# Patient Record
Sex: Female | Born: 1935 | Race: Asian | Hispanic: No | Marital: Married | Smoking: Former smoker
Health system: Southern US, Community
[De-identification: ages and names within clinical notes are randomized; demographics above are authoritative.]

## PROBLEM LIST (undated history)

## (undated) DIAGNOSIS — M1990 Unspecified osteoarthritis, unspecified site: Secondary | ICD-10-CM

## (undated) DIAGNOSIS — K279 Peptic ulcer, site unspecified, unspecified as acute or chronic, without hemorrhage or perforation: Secondary | ICD-10-CM

## (undated) DIAGNOSIS — I10 Essential (primary) hypertension: Secondary | ICD-10-CM

## (undated) DIAGNOSIS — E7800 Pure hypercholesterolemia, unspecified: Secondary | ICD-10-CM

---

## 1968-09-11 DIAGNOSIS — K279 Peptic ulcer, site unspecified, unspecified as acute or chronic, without hemorrhage or perforation: Secondary | ICD-10-CM

## 1968-09-11 HISTORY — DX: Peptic ulcer, site unspecified, unspecified as acute or chronic, without hemorrhage or perforation: K27.9

## 1997-10-10 ENCOUNTER — Other Ambulatory Visit: Admission: RE | Admit: 1997-10-10 | Discharge: 1997-10-10 | Payer: Self-pay | Admitting: Family Medicine

## 1999-02-06 ENCOUNTER — Encounter: Admission: RE | Admit: 1999-02-06 | Discharge: 1999-02-06 | Payer: Self-pay | Admitting: Family Medicine

## 1999-02-06 ENCOUNTER — Encounter: Payer: Self-pay | Admitting: Family Medicine

## 1999-11-02 ENCOUNTER — Encounter (INDEPENDENT_AMBULATORY_CARE_PROVIDER_SITE_OTHER): Payer: Self-pay | Admitting: Specialist

## 1999-11-02 ENCOUNTER — Ambulatory Visit (HOSPITAL_COMMUNITY): Admission: RE | Admit: 1999-11-02 | Discharge: 1999-11-02 | Payer: Self-pay | Admitting: Gastroenterology

## 2000-03-21 ENCOUNTER — Encounter: Admission: RE | Admit: 2000-03-21 | Discharge: 2000-03-21 | Payer: Self-pay | Admitting: Gastroenterology

## 2000-03-21 ENCOUNTER — Encounter: Payer: Self-pay | Admitting: Gastroenterology

## 2000-04-05 ENCOUNTER — Ambulatory Visit (HOSPITAL_COMMUNITY): Admission: RE | Admit: 2000-04-05 | Discharge: 2000-04-05 | Payer: Self-pay | Admitting: Gastroenterology

## 2000-04-05 ENCOUNTER — Encounter (INDEPENDENT_AMBULATORY_CARE_PROVIDER_SITE_OTHER): Payer: Self-pay

## 2000-05-10 ENCOUNTER — Encounter: Payer: Self-pay | Admitting: Family Medicine

## 2000-05-10 ENCOUNTER — Encounter: Admission: RE | Admit: 2000-05-10 | Discharge: 2000-05-10 | Payer: Self-pay | Admitting: Family Medicine

## 2001-06-01 ENCOUNTER — Encounter: Payer: Self-pay | Admitting: Family Medicine

## 2001-06-01 ENCOUNTER — Encounter: Admission: RE | Admit: 2001-06-01 | Discharge: 2001-06-01 | Payer: Self-pay | Admitting: Family Medicine

## 2001-11-17 ENCOUNTER — Encounter: Payer: Self-pay | Admitting: Family Medicine

## 2001-11-17 ENCOUNTER — Encounter: Admission: RE | Admit: 2001-11-17 | Discharge: 2001-11-17 | Payer: Self-pay | Admitting: Family Medicine

## 2002-06-04 ENCOUNTER — Encounter: Admission: RE | Admit: 2002-06-04 | Discharge: 2002-06-04 | Payer: Self-pay | Admitting: Family Medicine

## 2002-06-04 ENCOUNTER — Encounter: Payer: Self-pay | Admitting: Family Medicine

## 2002-08-31 ENCOUNTER — Other Ambulatory Visit: Admission: RE | Admit: 2002-08-31 | Discharge: 2002-08-31 | Payer: Self-pay | Admitting: Internal Medicine

## 2004-10-26 ENCOUNTER — Other Ambulatory Visit: Admission: RE | Admit: 2004-10-26 | Discharge: 2004-10-26 | Payer: Self-pay | Admitting: Family Medicine

## 2004-11-10 ENCOUNTER — Encounter: Admission: RE | Admit: 2004-11-10 | Discharge: 2004-11-10 | Payer: Self-pay | Admitting: Family Medicine

## 2005-11-11 ENCOUNTER — Encounter: Admission: RE | Admit: 2005-11-11 | Discharge: 2005-11-11 | Payer: Self-pay | Admitting: Family Medicine

## 2006-11-14 ENCOUNTER — Encounter: Admission: RE | Admit: 2006-11-14 | Discharge: 2006-11-14 | Payer: Self-pay | Admitting: Family Medicine

## 2007-12-18 ENCOUNTER — Encounter: Admission: RE | Admit: 2007-12-18 | Discharge: 2007-12-18 | Payer: Self-pay | Admitting: Family Medicine

## 2008-11-11 ENCOUNTER — Encounter: Admission: RE | Admit: 2008-11-11 | Discharge: 2008-11-11 | Payer: Self-pay | Admitting: Family Medicine

## 2009-01-20 ENCOUNTER — Encounter: Admission: RE | Admit: 2009-01-20 | Discharge: 2009-01-20 | Payer: Self-pay | Admitting: Family Medicine

## 2010-01-21 ENCOUNTER — Encounter
Admission: RE | Admit: 2010-01-21 | Discharge: 2010-01-21 | Payer: Self-pay | Source: Home / Self Care | Attending: Family Medicine | Admitting: Family Medicine

## 2010-05-29 NOTE — Procedures (Signed)
[facility]  Patient:    Jillian Campbell, Jillian Campbell                         MRN: [id number] Proc. Date: 04/05/00 Adm. Date:  [id number] Attending:  Orland Mustard CC:         Duncan Dull, M.D.   Procedure Report  PROCEDURE:  Esophagogastroduodenoscopy with biopsy.  MEDICATIONS:  Cetacaine spray, fentanyl 50 mcg, Versed 7 mg IV.  INDICATIONS FOR PROCEDURE:  A nice 77 year old woman who had an endoscopy in October with the finding of small gastric ulcer. The patients been treated, a repeat upper GI still showed a persistent area of collection consistent with a continued ulcer. This procedure is done to evaluate the area endoscopically and rebiopsy.  DESCRIPTION OF PROCEDURE:  The procedure had been explained to the patient and consent obtained. With the patient in the left lateral decubitus position, the Olympus adult video endoscope was inserted blindly in the esophagus and advanced under direct visualization. The stomach was entered, pylorus identified and passed. The duodenum including the bulb and second portion were seen well and were unremarkable. The scope withdrawn back in the stomach. Just above the incisura along the lesser curve was a shallow 1/2 cm ulcer. This was photographed and biopsied. An area away from the ulcer was also biopsied and sent for a rapid urease test with Helicobacter. No other ulcerations were seen. The scope was withdrawn. The proximal stomach including the fundus and cardia were seen well in the retroflexed view and were normal. The distal and proximal esophagus were seen well and were normal. The scope withdrawn. The patient tolerated the procedure well.  ASSESSMENT:  Ulcer still present.  PLAN:  Will continue the patient on Prevacid and see back in my office in three to four weeks. Will check path and make further recommendations depending on the results. DD:  04/05/00 TD:  04/05/00 Job: [id number]

## 2010-12-21 ENCOUNTER — Other Ambulatory Visit: Payer: Self-pay | Admitting: Family Medicine

## 2010-12-21 DIAGNOSIS — Z1231 Encounter for screening mammogram for malignant neoplasm of breast: Secondary | ICD-10-CM

## 2011-01-25 ENCOUNTER — Ambulatory Visit
Admission: RE | Admit: 2011-01-25 | Discharge: 2011-01-25 | Disposition: A | Discharge status: Home / Self Care | Payer: Medicare Other | Source: Ambulatory Visit | Attending: Family Medicine | Admitting: Family Medicine

## 2011-01-25 DIAGNOSIS — Z1231 Encounter for screening mammogram for malignant neoplasm of breast: Secondary | ICD-10-CM

## 2011-11-29 ENCOUNTER — Other Ambulatory Visit: Payer: Self-pay | Admitting: Family Medicine

## 2011-11-29 DIAGNOSIS — M810 Age-related osteoporosis without current pathological fracture: Secondary | ICD-10-CM

## 2011-12-17 ENCOUNTER — Ambulatory Visit
Admission: RE | Admit: 2011-12-17 | Discharge: 2011-12-17 | Disposition: A | Discharge status: Home / Self Care | Payer: Medicare Other | Source: Ambulatory Visit | Attending: Family Medicine | Admitting: Family Medicine

## 2011-12-17 DIAGNOSIS — M810 Age-related osteoporosis without current pathological fracture: Secondary | ICD-10-CM

## 2013-03-02 ENCOUNTER — Encounter (HOSPITAL_COMMUNITY): Payer: Self-pay | Admitting: Emergency Medicine

## 2013-03-02 ENCOUNTER — Observation Stay (HOSPITAL_COMMUNITY)
Admission: EM | Admit: 2013-03-02 | Discharge: 2013-03-05 | Disposition: A | Discharge status: Home / Self Care | Payer: Medicare Other | Attending: Orthopaedic Surgery | Admitting: Orthopaedic Surgery

## 2013-03-02 ENCOUNTER — Emergency Department (HOSPITAL_COMMUNITY): Payer: Medicare Other

## 2013-03-02 DIAGNOSIS — S42409A Unspecified fracture of lower end of unspecified humerus, initial encounter for closed fracture: Secondary | ICD-10-CM

## 2013-03-02 DIAGNOSIS — S42473A Displaced transcondylar fracture of unspecified humerus, initial encounter for closed fracture: Secondary | ICD-10-CM | POA: Diagnosis present

## 2013-03-02 DIAGNOSIS — W19XXXA Unspecified fall, initial encounter: Secondary | ICD-10-CM | POA: Insufficient documentation

## 2013-03-02 DIAGNOSIS — I10 Essential (primary) hypertension: Secondary | ICD-10-CM | POA: Insufficient documentation

## 2013-03-02 DIAGNOSIS — Z87891 Personal history of nicotine dependence: Secondary | ICD-10-CM | POA: Insufficient documentation

## 2013-03-02 DIAGNOSIS — S42413A Displaced simple supracondylar fracture without intercondylar fracture of unspecified humerus, initial encounter for closed fracture: Principal | ICD-10-CM | POA: Insufficient documentation

## 2013-03-02 HISTORY — DX: Essential (primary) hypertension: I10

## 2013-03-02 LAB — CBC WITH DIFFERENTIAL/PLATELET
BASOS ABS: 0 10*3/uL (ref 0.0–0.1)
BASOS PCT: 0 % (ref 0–1)
EOS ABS: 0 10*3/uL (ref 0.0–0.7)
EOS PCT: 0 % (ref 0–5)
HCT: 34.5 % — ABNORMAL LOW (ref 36.0–46.0)
Hemoglobin: 11 g/dL — ABNORMAL LOW (ref 12.0–15.0)
LYMPHS ABS: 0.8 10*3/uL (ref 0.7–4.0)
Lymphocytes Relative: 11 % — ABNORMAL LOW (ref 12–46)
MCH: 28.8 pg (ref 26.0–34.0)
MCHC: 31.9 g/dL (ref 30.0–36.0)
MCV: 90.3 fL (ref 78.0–100.0)
MONO ABS: 0.8 10*3/uL (ref 0.1–1.0)
Monocytes Relative: 12 % (ref 3–12)
NEUTROS PCT: 76 % (ref 43–77)
Neutro Abs: 5.2 10*3/uL (ref 1.7–7.7)
PLATELETS: 210 10*3/uL (ref 150–400)
RBC: 3.82 MIL/uL — AB (ref 3.87–5.11)
RDW: 12.3 % (ref 11.5–15.5)
WBC: 6.8 10*3/uL (ref 4.0–10.5)

## 2013-03-02 LAB — BASIC METABOLIC PANEL
BUN: 30 mg/dL — ABNORMAL HIGH (ref 6–23)
CO2: 27 meq/L (ref 19–32)
Calcium: 9.1 mg/dL (ref 8.4–10.5)
Chloride: 96 mEq/L (ref 96–112)
Creatinine, Ser: 1.32 mg/dL — ABNORMAL HIGH (ref 0.50–1.10)
GFR calc Af Amer: 43 mL/min — ABNORMAL LOW (ref >90)
GFR calc non Af Amer: 37 mL/min — ABNORMAL LOW (ref >90)
GLUCOSE: 135 mg/dL — AB (ref 70–99)
POTASSIUM: 4.2 meq/L (ref 3.7–5.3)
SODIUM: 137 meq/L (ref 137–147)

## 2013-03-02 LAB — PROTIME-INR
INR: 1.03 (ref 0.00–1.49)
PROTHROMBIN TIME: 13.3 s (ref 11.6–15.2)

## 2013-03-02 MED ORDER — MORPHINE SULFATE 2 MG/ML IJ SOLN
2.0000 mg | INTRAMUSCULAR | Status: DC | PRN
  Administered 2013-03-02: 2 mg via INTRAVENOUS
  Filled 2013-03-02: qty 1

## 2013-03-02 MED ORDER — SODIUM CHLORIDE 0.9 % IV SOLN
INTRAVENOUS | Status: DC
  Administered 2013-03-02 – 2013-03-03 (×2): via INTRAVENOUS

## 2013-03-02 MED ORDER — DIPHENHYDRAMINE HCL 25 MG PO CAPS: 25.0000 mg | ORAL_CAPSULE | Freq: Four times a day (QID) | ORAL | Status: DC | PRN

## 2013-03-02 MED ORDER — HYDROCODONE-ACETAMINOPHEN 5-325 MG PO TABS: 1.0000 | ORAL_TABLET | ORAL | Status: DC | PRN

## 2013-03-02 MED ORDER — HYDROCODONE-ACETAMINOPHEN 5-325 MG PO TABS
2.0000 | ORAL_TABLET | Freq: Once | ORAL | Status: AC
  Administered 2013-03-02: 2 via ORAL
  Filled 2013-03-02: qty 2

## 2013-03-02 NOTE — Progress Notes (Signed)
   CARE MANAGEMENT ED NOTE 03/02/2013  Patient:  Jillian Campbell,Jillian Campbell   Account Number:  [id number]  Date Initiated:  03/02/2013  Documentation initiated by:  Edd ArbourGIBBS,Elhadji Pecore  Subjective/Objective Assessment:   80 yr old [employer] medicare complete pt with fall, injury to right arm no pcp listed     Subjective/Objective Assessment Detail:   pt confirm pcp as donna gates     Action/Plan:   epic updated   Action/Plan Detail:   Anticipated DC Date:       Status Recommendation to Physician:   Result of Recommendation:    Other ED Services  Consult Working Psychologist, educationallan    DC Planning Services  Other  Outpatient Services - Pt will follow up  PCP issues    Choice offered to / List presented to:            Status of service:  Completed, signed off  ED Comments:   ED Comments Detail:

## 2013-03-02 NOTE — ED Provider Notes (Signed)
CSN: [id number]     Arrival date & time 03/02/13  1542 History   First MD Initiated Contact with Patient 03/02/13 1554     Chief Complaint  Patient presents with  . Fall     (Consider location/radiation/quality/duration/timing/severity/associated sxs/prior Treatment) HPI Comments: Patient presents to the ED with a chief complaint of fall.  The fall was mechanical from losing her balance.  She states that she fell twice 2 days ago.  She complains of pain on her right elbow and left flank.  She has not taken anything to alleviate her symptoms.  She does not take any blood thinners.  The pain is constant.  It is worse with movement.  She is able to ambulate.  She denies any chest pain, SOB, or abdominal pain.  The history is provided by the patient. No language interpreter was used.    Past Medical History  Diagnosis Date  . Hypertension    History reviewed. No pertinent past surgical history. No family history on file. History  Substance Use Topics  . Smoking status: Former Smoker -- 0.50 packs/day for 40 years    Types: Cigarettes    Quit date: 01/12/1968  . Smokeless tobacco: Never Used  . Alcohol Use: No   OB History   Grav Para Term Preterm Abortions TAB SAB Ect Mult Living                 Review of Systems  All other systems reviewed and are negative.      Allergies  Review of patient's allergies indicates no known allergies.  Home Medications  No current outpatient prescriptions on file. BP 126/58  Pulse 81  Temp(Src) 97.8 F (36.6 C) (Oral)  Resp 17  SpO2 93% Physical Exam  Nursing note and vitals reviewed. Constitutional: She is oriented to person, place, and time. She appears well-developed and well-nourished.  HENT:  Head: Normocephalic and atraumatic.  Eyes: Conjunctivae and EOM are normal. Pupils are equal, round, and reactive to light.  Neck: Normal range of motion. Neck supple.  Cardiovascular: Normal rate and regular rhythm.  Exam reveals no  gallop and no friction rub.   No murmur heard. Pulmonary/Chest: Effort normal and breath sounds normal. No respiratory distress. She has no wheezes. She has no rales. She exhibits no tenderness.  Abdominal: Soft. Bowel sounds are normal. She exhibits no distension and no mass. There is no tenderness. There is no rebound and no guarding.  No focal abdominal tenderness, no RLQ tenderness or pain at McBurney's point, no RUQ tenderness or Murphy's sign, no left-sided abdominal tenderness, no fluid wave, or signs of peritonitis   Musculoskeletal: Normal range of motion. She exhibits no edema and no tenderness.  Extensive ecchymosis of the entire right arm circumferentially, no pain with PROM, exquisitely tender over the right elbow  No pain on palpation of other extremities, hip, neck, or back  Otherwise, ROM and strength is 5/5  Neurological: She is alert and oriented to person, place, and time.  Skin: Skin is warm and dry.  Additional ecchymosis noted on right lateral neck below the ear and on the left flank, above the hip, but below the kidney  Psychiatric: She has a normal mood and affect. Her behavior is normal. Judgment and thought content normal.    ED Course  Procedures (including critical care time) Results for orders placed during the hospital encounter of 03/02/13  PROTIME-INR      Result Value Ref Range   Prothrombin Time 13.3  11.6 - 15.2 seconds   INR 1.03  0.00 - 1.49  CBC WITH DIFFERENTIAL      Result Value Ref Range   WBC 6.8  4.0 - 10.5 K/uL   RBC 3.82 (*) 3.87 - 5.11 MIL/uL   Hemoglobin 11.0 (*) 12.0 - 15.0 g/dL   HCT 96.0 (*) 45.4 - 09.8 %   MCV 90.3  78.0 - 100.0 fL   MCH 28.8  26.0 - 34.0 pg   MCHC 31.9  30.0 - 36.0 g/dL   RDW 11.9  14.7 - 82.9 %   Platelets 210  150 - 400 K/uL   Neutrophils Relative % 76  43 - 77 %   Neutro Abs 5.2  1.7 - 7.7 K/uL   Lymphocytes Relative 11 (*) 12 - 46 %   Lymphs Abs 0.8  0.7 - 4.0 K/uL   Monocytes Relative 12  3 - 12 %    Monocytes Absolute 0.8  0.1 - 1.0 K/uL   Eosinophils Relative 0  0 - 5 %   Eosinophils Absolute 0.0  0.0 - 0.7 K/uL   Basophils Relative 0  0 - 1 %   Basophils Absolute 0.0  0.0 - 0.1 K/uL  BASIC METABOLIC PANEL      Result Value Ref Range   Sodium 137  137 - 147 mEq/L   Potassium 4.2  3.7 - 5.3 mEq/L   Chloride 96  96 - 112 mEq/L   CO2 27  19 - 32 mEq/L   Glucose, Bld 135 (*) 70 - 99 mg/dL   BUN 30 (*) 6 - 23 mg/dL   Creatinine, Ser 5.62 (*) 0.50 - 1.10 mg/dL   Calcium 9.1  8.4 - 13.0 mg/dL   GFR calc non Af Amer 37 (*) >90 mL/min   GFR calc Af Amer 43 (*) >90 mL/min   Dg Elbow 2 Views Right  03/02/2013   CLINICAL DATA:  [place] Seat and injured right arm. Bruising throughout the arm.  EXAM: RIGHT ELBOW - 2 VIEW  COMPARISON:  Right forearm x-rays obtained concurrently.  FINDINGS: Comminuted transverse fracture involving the supracondylar humerus. Humeroulnar joint and humeroradial joint intact with moderate joint space narrowing. No other visible fractures about the elbow. Large joint effusion/hemarthrosis. Diffuse soft tissue swelling.  IMPRESSION: Comminuted transverse fracture involving the supracondylar humerus. Elbow joint intact with degenerative changes.   Electronically Signed   By: Hulan Saas M.D.   On: 03/02/2013 18:19   Dg Forearm Right  03/02/2013   CLINICAL DATA:  Fall, elbow pain  EXAM: RIGHT FOREARM - 2 VIEW  COMPARISON:  None.  FINDINGS: There is an acute displaced right distal humerus supracondylar fracture. Diffuse soft tissue swelling noted on the lateral view. Bones are osteopenic. Radius and ulna appear intact.  IMPRESSION: Acute displaced right distal humerus supracondylar fracture at the elbow.   Electronically Signed   By: Ruel Favors M.D.   On: 03/02/2013 18:20      EKG Interpretation   None       MDM   Final diagnoses:  Fracture of distal humerus    4:15 PM Patient seen by and discussed with Dr. Fredderick Phenix.  Will obtain plain films.  No evidence of  compartment syndrome.  No pain with passive ROM, good pulses.  Question dislocation.  Labs and xrays.   7:45 PM Discussed the patient with Dr. Melvyn Novas, who recommends consulting general orthopedic surgery.    Discussed the patient with Dr. Magnus Ivan, who recommends a well padded posterior splint  and sling.  He will see the patient on Monday.  Patient seen by orthopedic PA, who will admit the patient.  Surgery tomorrow.   Roxy Horsemanobert Dagmar Adcox, PA-C 03/02/13 2036

## 2013-03-02 NOTE — H&P (Signed)
I have seen Mrs. Jillian Campbell, examined her, and spoken to her and her husband in length about her right distal humerus fracture.  They understand that surgery is being recommended to treat this complicated injury.

## 2013-03-02 NOTE — H&P (Signed)
Jillian Campbell is an 80 y.o. female.   Chief Complaint: Right elbow fracture HPI:80 year old female who fell 2 days ago with right elbow pain. Denies any LOC, dizziness or chest pain. Only complaint is right elbow pain.  Past Medical History  Diagnosis Date  . Hypertension     History reviewed. No pertinent past surgical history.  No family history on file. Social History:  reports that she quit smoking about 45 years ago. Her smoking use included Cigarettes. She has a 20 pack-year smoking history. She has never used smokeless tobacco. She reports that she does not drink alcohol or use illicit drugs.  Allergies: No Known Allergies   (Not in a hospital admission)  Results for orders placed during the hospital encounter of 03/02/13 (from the past 48 hour(s))  PROTIME-INR     Status: None   Collection Time    03/02/13  4:38 PM      Result Value Ref Range   Prothrombin Time 13.3  11.6 - 15.2 seconds   INR 1.03  0.00 - 1.49  CBC WITH DIFFERENTIAL     Status: Abnormal   Collection Time    03/02/13  4:38 PM      Result Value Ref Range   WBC 6.8  4.0 - 10.5 K/uL   RBC 3.82 (*) 3.87 - 5.11 MIL/uL   Hemoglobin 11.0 (*) 12.0 - 15.0 g/dL   HCT 34.5 (*) 36.0 - 46.0 %   MCV 90.3  78.0 - 100.0 fL   MCH 28.8  26.0 - 34.0 pg   MCHC 31.9  30.0 - 36.0 g/dL   RDW 12.3  11.5 - 15.5 %   Platelets 210  150 - 400 K/uL   Neutrophils Relative % 76  43 - 77 %   Neutro Abs 5.2  1.7 - 7.7 K/uL   Lymphocytes Relative 11 (*) 12 - 46 %   Lymphs Abs 0.8  0.7 - 4.0 K/uL   Monocytes Relative 12  3 - 12 %   Monocytes Absolute 0.8  0.1 - 1.0 K/uL   Eosinophils Relative 0  0 - 5 %   Eosinophils Absolute 0.0  0.0 - 0.7 K/uL   Basophils Relative 0  0 - 1 %   Basophils Absolute 0.0  0.0 - 0.1 K/uL  BASIC METABOLIC PANEL     Status: Abnormal   Collection Time    03/02/13  4:38 PM      Result Value Ref Range   Sodium 137  137 - 147 mEq/L   Potassium 4.2  3.7 - 5.3 mEq/L   Chloride 96  96 - 112 mEq/L   CO2  27  19 - 32 mEq/L   Glucose, Bld 135 (*) 70 - 99 mg/dL   BUN 30 (*) 6 - 23 mg/dL   Creatinine, Ser 1.32 (*) 0.50 - 1.10 mg/dL   Calcium 9.1  8.4 - 10.5 mg/dL   GFR calc non Af Amer 37 (*) >90 mL/min   GFR calc Af Amer 43 (*) >90 mL/min   Comment: (NOTE)     The eGFR has been calculated using the CKD EPI equation.     This calculation has not been validated in all clinical situations.     eGFR's persistently <90 mL/min signify possible Chronic Kidney     Disease.   Dg Elbow 2 Views Right  03/02/2013   CLINICAL DATA:  [place] and injured right arm. Bruising throughout the arm.  EXAM: RIGHT ELBOW - 2 VIEW  COMPARISON:  Right forearm x-rays obtained concurrently.  FINDINGS: Comminuted transverse fracture involving the supracondylar humerus. Humeroulnar joint and humeroradial joint intact with moderate joint space narrowing. No other visible fractures about the elbow. Large joint effusion/hemarthrosis. Diffuse soft tissue swelling.  IMPRESSION: Comminuted transverse fracture involving the supracondylar humerus. Elbow joint intact with degenerative changes.   Electronically Signed   By: Evangeline Dakin M.D.   On: 03/02/2013 18:19   Dg Forearm Right  03/02/2013   CLINICAL DATA:  Fall, elbow pain  EXAM: RIGHT FOREARM - 2 VIEW  COMPARISON:  None.  FINDINGS: There is an acute displaced right distal humerus supracondylar fracture. Diffuse soft tissue swelling noted on the lateral view. Bones are osteopenic. Radius and ulna appear intact.  IMPRESSION: Acute displaced right distal humerus supracondylar fracture at the elbow.   Electronically Signed   By: Daryll Brod M.D.   On: 03/02/2013 18:20    Review of Systems  Constitutional: Negative.   HENT: Negative.   Respiratory: Negative.   Cardiovascular: Negative.   Gastrointestinal: Negative.   Musculoskeletal:       Right arm pain  Skin: Negative.   Neurological: Negative.   Endo/Heme/Allergies: Negative.   Psychiatric/Behavioral: Negative.      Blood pressure 144/44, pulse 73, temperature 98.8 F (37.1 C), temperature source Oral, resp. rate 20, SpO2 90.00%. Physical Exam  Constitutional: She is oriented to person, place, and time. She appears well-developed and well-nourished.  HENT:  Head: Normocephalic and atraumatic.  Eyes: EOM are normal.  Cardiovascular: Normal rate.   Radial pulses present bilateral  Respiratory: Effort normal.  Musculoskeletal:  Deformity of right elbow Bilateral lower lower extremities Good range of motion knees and hips without pain. Non tender bilateral lower extremities  Neurological: She is alert and oriented to person, place, and time.  Sensation intact right hand with full motor  Skin: Skin is warm and dry.  Right distal upper and complete forearm with significant ecchymosis.  Tenting of skin at radial side of distal humerus.  Ecchymosis of the right side of neck  Psychiatric: She has a normal mood and affect.     Assessment/Plan Comminuted transverse fracture involving the supracondylar right humerus.  Admit for ORIF of right  Tomorrow. Right arm splinted at 45 degrees with posterior splint applied. Tenting of skin resolved with 45 degrees of flexion . Patient place in sling . Motor and sensation rechecked and remained intact.  Erskine Emery 03/02/2013, 8:51 PM

## 2013-03-02 NOTE — ED Provider Notes (Signed)
Medical screening examination/treatment/procedure(s) were conducted as a shared visit with non-physician practitioner(s) and myself.  I personally evaluated the patient during the encounter.    Pt with left elbow pain and swelling. She has been notable deformity. There's no evidence of compartment syndrome. X-ray shows a distal humerus fracture. She'll be admitted to orthopedic  Rolan BuccoMelanie Elvera Almario, MD 03/02/13 2324

## 2013-03-02 NOTE — ED Notes (Addendum)
Pt reports falling twice at home two days ago. Pt presents with multiple areas of ecchymosis. Pt has ecchymosis noted behind the right ear and left flank during brief exam. Pt has right arm pain and has ecchymosis from above the elbow to the hand. The hand has moderate swelling and is blue/purple in appearance.

## 2013-03-02 NOTE — ED Notes (Signed)
Case management at bedside.

## 2013-03-02 NOTE — ED Notes (Signed)
MD at bedside. 

## 2013-03-03 ENCOUNTER — Encounter (HOSPITAL_COMMUNITY): Payer: Medicare Other | Admitting: Certified Registered Nurse Anesthetist

## 2013-03-03 ENCOUNTER — Encounter (HOSPITAL_COMMUNITY)
Admission: EM | Disposition: A | Discharge status: Home / Self Care | Payer: Self-pay | Source: Home / Self Care | Attending: Orthopaedic Surgery

## 2013-03-03 ENCOUNTER — Encounter (HOSPITAL_COMMUNITY): Payer: Self-pay | Admitting: Certified Registered Nurse Anesthetist

## 2013-03-03 ENCOUNTER — Observation Stay (HOSPITAL_COMMUNITY): Payer: Medicare Other

## 2013-03-03 ENCOUNTER — Inpatient Hospital Stay (HOSPITAL_COMMUNITY): Payer: Medicare Other | Admitting: Certified Registered Nurse Anesthetist

## 2013-03-03 DIAGNOSIS — S42473A Displaced transcondylar fracture of unspecified humerus, initial encounter for closed fracture: Secondary | ICD-10-CM | POA: Diagnosis present

## 2013-03-03 HISTORY — PX: OPEN REDUCTION INTERNAL FIXATION (ORIF) DISTAL RADIAL FRACTURE: SHX5989

## 2013-03-03 LAB — SURGICAL PCR SCREEN
MRSA, PCR: NEGATIVE
STAPHYLOCOCCUS AUREUS: NEGATIVE

## 2013-03-03 SURGERY — OPEN REDUCTION INTERNAL FIXATION (ORIF) DISTAL RADIUS FRACTURE
Anesthesia: General | Site: Arm Upper | Laterality: Right

## 2013-03-03 MED ORDER — METOCLOPRAMIDE HCL 5 MG/ML IJ SOLN: 5.0000 mg | Freq: Three times a day (TID) | INTRAMUSCULAR | Status: DC | PRN

## 2013-03-03 MED ORDER — CEFAZOLIN SODIUM-DEXTROSE 2-3 GM-% IV SOLR
INTRAVENOUS | Status: AC
  Filled 2013-03-03: qty 50

## 2013-03-03 MED ORDER — METHOCARBAMOL 500 MG PO TABS
500.0000 mg | ORAL_TABLET | Freq: Four times a day (QID) | ORAL | Status: DC | PRN
  Administered 2013-03-03 – 2013-03-05 (×4): 500 mg via ORAL
  Filled 2013-03-03 (×4): qty 1

## 2013-03-03 MED ORDER — METHOCARBAMOL 100 MG/ML IJ SOLN
500.0000 mg | Freq: Four times a day (QID) | INTRAVENOUS | Status: DC | PRN
  Administered 2013-03-03: 500 mg via INTRAVENOUS
  Filled 2013-03-03: qty 5

## 2013-03-03 MED ORDER — ASPIRIN 81 MG PO CHEW
81.0000 mg | CHEWABLE_TABLET | Freq: Two times a day (BID) | ORAL | Status: DC
  Filled 2013-03-03: qty 1

## 2013-03-03 MED ORDER — OXYCODONE HCL 5 MG PO TABS
5.0000 mg | ORAL_TABLET | ORAL | Status: DC | PRN
Start: 2013-03-03 — End: 2013-03-05
  Administered 2013-03-03: 5 mg via ORAL
  Administered 2013-03-03 – 2013-03-04 (×2): 10 mg via ORAL
  Filled 2013-03-03: qty 1
  Filled 2013-03-03 (×2): qty 2

## 2013-03-03 MED ORDER — CEFAZOLIN SODIUM-DEXTROSE 2-3 GM-% IV SOLR
2.0000 g | Freq: Once | INTRAVENOUS | Status: AC
  Administered 2013-03-03: 2 g via INTRAVENOUS
  Filled 2013-03-03: qty 50

## 2013-03-03 MED ORDER — PROPOFOL 10 MG/ML IV BOLUS
INTRAVENOUS | Status: DC | PRN
  Administered 2013-03-03: 100 mg via INTRAVENOUS

## 2013-03-03 MED ORDER — MORPHINE SULFATE 2 MG/ML IJ SOLN
1.0000 mg | INTRAMUSCULAR | Status: DC | PRN
  Administered 2013-03-03: 1 mg via INTRAVENOUS
  Filled 2013-03-03: qty 1

## 2013-03-03 MED ORDER — SODIUM CHLORIDE 0.9 % IV SOLN: INTRAVENOUS | Status: DC

## 2013-03-03 MED ORDER — ONDANSETRON HCL 4 MG/2ML IJ SOLN
INTRAMUSCULAR | Status: AC
  Filled 2013-03-03: qty 2

## 2013-03-03 MED ORDER — DIPHENHYDRAMINE HCL 12.5 MG/5ML PO ELIX: 12.5000 mg | ORAL_SOLUTION | ORAL | Status: DC | PRN

## 2013-03-03 MED ORDER — LACTATED RINGERS IV SOLN: INTRAVENOUS | Status: DC

## 2013-03-03 MED ORDER — FENTANYL CITRATE 0.05 MG/ML IJ SOLN
INTRAMUSCULAR | Status: DC | PRN
  Administered 2013-03-03 (×5): 50 ug via INTRAVENOUS

## 2013-03-03 MED ORDER — HYDROMORPHONE HCL PF 1 MG/ML IJ SOLN
0.2500 mg | INTRAMUSCULAR | Status: DC | PRN
  Administered 2013-03-03 (×2): 0.5 mg via INTRAVENOUS

## 2013-03-03 MED ORDER — FENTANYL CITRATE 0.05 MG/ML IJ SOLN
INTRAMUSCULAR | Status: AC
  Filled 2013-03-03: qty 5

## 2013-03-03 MED ORDER — AMLODIPINE BESYLATE 2.5 MG PO TABS
2.5000 mg | ORAL_TABLET | Freq: Every day | ORAL | Status: DC
  Administered 2013-03-03 – 2013-03-05 (×2): 2.5 mg via ORAL
  Filled 2013-03-03 (×3): qty 1

## 2013-03-03 MED ORDER — 0.9 % SODIUM CHLORIDE (POUR BTL) OPTIME
TOPICAL | Status: DC | PRN
  Administered 2013-03-03: 1000 mL

## 2013-03-03 MED ORDER — LISINOPRIL 20 MG PO TABS
20.0000 mg | ORAL_TABLET | Freq: Every day | ORAL | Status: DC
  Administered 2013-03-03 – 2013-03-05 (×2): 20 mg via ORAL
  Filled 2013-03-03 (×3): qty 1

## 2013-03-03 MED ORDER — CEFAZOLIN SODIUM 1-5 GM-% IV SOLN
1.0000 g | Freq: Four times a day (QID) | INTRAVENOUS | Status: AC
  Administered 2013-03-03 – 2013-03-04 (×3): 1 g via INTRAVENOUS
  Filled 2013-03-03 (×3): qty 50

## 2013-03-03 MED ORDER — ONDANSETRON HCL 4 MG/2ML IJ SOLN
INTRAMUSCULAR | Status: DC | PRN
  Administered 2013-03-03: 4 mg via INTRAVENOUS

## 2013-03-03 MED ORDER — HYDROMORPHONE HCL PF 1 MG/ML IJ SOLN
INTRAMUSCULAR | Status: AC
  Filled 2013-03-03: qty 1

## 2013-03-03 MED ORDER — METOCLOPRAMIDE HCL 10 MG PO TABS: 5.0000 mg | ORAL_TABLET | Freq: Three times a day (TID) | ORAL | Status: DC | PRN

## 2013-03-03 MED ORDER — HYDROCODONE-ACETAMINOPHEN 5-325 MG PO TABS
1.0000 | ORAL_TABLET | ORAL | Status: DC | PRN
  Administered 2013-03-03: 2 via ORAL
  Filled 2013-03-03: qty 2

## 2013-03-03 MED ORDER — PROPOFOL 10 MG/ML IV BOLUS
INTRAVENOUS | Status: AC
  Filled 2013-03-03: qty 20

## 2013-03-03 MED ORDER — ONDANSETRON HCL 4 MG/2ML IJ SOLN: 4.0000 mg | Freq: Four times a day (QID) | INTRAMUSCULAR | Status: DC | PRN

## 2013-03-03 MED ORDER — LIDOCAINE HCL (CARDIAC) 20 MG/ML IV SOLN
INTRAVENOUS | Status: AC
  Filled 2013-03-03: qty 5

## 2013-03-03 MED ORDER — LACTATED RINGERS IV SOLN
INTRAVENOUS | Status: DC | PRN
  Administered 2013-03-03: 08:00:00 via INTRAVENOUS

## 2013-03-03 MED ORDER — SUCCINYLCHOLINE CHLORIDE 20 MG/ML IJ SOLN
INTRAMUSCULAR | Status: DC | PRN
  Administered 2013-03-03: 100 mg via INTRAVENOUS

## 2013-03-03 MED ORDER — ATENOLOL 25 MG PO TABS
25.0000 mg | ORAL_TABLET | Freq: Every day | ORAL | Status: DC
  Administered 2013-03-03 – 2013-03-05 (×2): 25 mg via ORAL
  Filled 2013-03-03 (×3): qty 1

## 2013-03-03 MED ORDER — ONDANSETRON HCL 4 MG PO TABS: 4.0000 mg | ORAL_TABLET | Freq: Four times a day (QID) | ORAL | Status: DC | PRN

## 2013-03-03 MED ORDER — DEXAMETHASONE SODIUM PHOSPHATE 10 MG/ML IJ SOLN
INTRAMUSCULAR | Status: AC
  Filled 2013-03-03: qty 1

## 2013-03-03 MED ORDER — SIMVASTATIN 20 MG PO TABS
20.0000 mg | ORAL_TABLET | Freq: Every day | ORAL | Status: DC
  Administered 2013-03-03 – 2013-03-04 (×2): 20 mg via ORAL
  Filled 2013-03-03 (×3): qty 1

## 2013-03-03 MED ORDER — ASPIRIN 81 MG PO CHEW
81.0000 mg | CHEWABLE_TABLET | Freq: Every day | ORAL | Status: DC
  Administered 2013-03-04 – 2013-03-05 (×2): 81 mg via ORAL
  Filled 2013-03-03 (×2): qty 1

## 2013-03-03 MED ORDER — LIDOCAINE HCL (CARDIAC) 20 MG/ML IV SOLN
INTRAVENOUS | Status: DC | PRN
  Administered 2013-03-03: 50 mg via INTRAVENOUS

## 2013-03-03 SURGICAL SUPPLY — 70 items
BAG SPEC THK2 15X12 ZIP CLS (MISCELLANEOUS) ×1
BAG ZIPLOCK 12X15 (MISCELLANEOUS) ×3 IMPLANT
BANDAGE ELASTIC 3 VELCRO ST LF (GAUZE/BANDAGES/DRESSINGS) ×3 IMPLANT
BANDAGE ELASTIC 4 VELCRO ST LF (GAUZE/BANDAGES/DRESSINGS) ×5 IMPLANT
BIT DRILL 2.0 LNG QUCK RELEASE (BIT) IMPLANT
BIT DRILL 2.8 QUICK RELEASE (BIT) IMPLANT
BLADE OSCILLATING/SAGITTAL (BLADE) ×3
BLADE SW THK.38XMED LNG THN (BLADE) IMPLANT
CLOSURE WOUND 1/2 X4 (GAUZE/BANDAGES/DRESSINGS) ×1
CUFF TOURN SGL QUICK 18 (TOURNIQUET CUFF) ×3 IMPLANT
DRAPE C-ARM 42X120 X-RAY (DRAPES) ×3 IMPLANT
DRAPE U-SHAPE 47X51 STRL (DRAPES) ×3 IMPLANT
DRILL 2.0 LNG QUICK RELEASE (BIT) ×3
DRILL 2.8 QUICK RELEASE (BIT) ×3
DRSG ADAPTIC 3X8 NADH LF (GAUZE/BANDAGES/DRESSINGS) ×3 IMPLANT
DURAPREP 26ML APPLICATOR (WOUND CARE) ×3 IMPLANT
ELECT REM PT RETURN 9FT ADLT (ELECTROSURGICAL) ×3
ELECTRODE REM PT RTRN 9FT ADLT (ELECTROSURGICAL) ×1 IMPLANT
GAUZE XEROFORM 5X9 LF (GAUZE/BANDAGES/DRESSINGS) ×2 IMPLANT
GLOVE BIO SURGEON STRL SZ7.5 (GLOVE) ×3 IMPLANT
GLOVE BIOGEL PI IND STRL 8 (GLOVE) ×1 IMPLANT
GLOVE BIOGEL PI INDICATOR 8 (GLOVE) ×2
GLOVE ECLIPSE 8.0 STRL XLNG CF (GLOVE) ×3 IMPLANT
GOWN STRL REUS W/TWL XL LVL3 (GOWN DISPOSABLE) ×3 IMPLANT
GUIDEWIRE ORTHO MINI ACTK .045 (WIRE) ×6 IMPLANT
KIT BASIN OR (CUSTOM PROCEDURE TRAY) ×3 IMPLANT
NS IRRIG 1000ML POUR BTL (IV SOLUTION) ×3 IMPLANT
PACK LOWER EXTREMITY WL (CUSTOM PROCEDURE TRAY) ×3 IMPLANT
PAD ABD 8X10 STRL (GAUZE/BANDAGES/DRESSINGS) ×3 IMPLANT
PAD CAST 3X4 CTTN HI CHSV (CAST SUPPLIES) ×1 IMPLANT
PAD CAST 4YDX4 CTTN HI CHSV (CAST SUPPLIES) ×1 IMPLANT
PADDING CAST COTTON 3X4 STRL (CAST SUPPLIES) ×3
PADDING CAST COTTON 4X4 STRL (CAST SUPPLIES) ×6
PLATE BONE 85MM OLECRN RT NAR (Plate) ×2 IMPLANT
PLATE BONE POST 5H RT ELBOW (Plate) ×2 IMPLANT
PLATE MEDIAL 8 HOLE (Plate) ×2 IMPLANT
PLATE TAP FOR 3.5 SCREW (TAP) ×2 IMPLANT
POSITIONER SURGICAL ARM (MISCELLANEOUS) ×5 IMPLANT
SCREW CORTICAL 3.5X20MM (Screw) ×2 IMPLANT
SCREW HEX LOCK 2.7X14MM (Screw) ×6 IMPLANT
SCREW HEX LOCK 2.7X16MM (Screw) ×2 IMPLANT
SCREW HEX LOCK 3.5X20MM (Screw) ×4 IMPLANT
SCREW HEX NON LOCK 3.5X34MM (Screw) ×2 IMPLANT
SCREW HEXALOBE LOCK 3.5X22MM (Screw) ×2 IMPLANT
SCREW HEXALOBE LOCKING 3.5X14M (Screw) ×2 IMPLANT
SCREW HEXALOBE LOCKING 3.5X16M (Screw) ×2 IMPLANT
SCREW HEXALOBE NON-LOCK 3.5X16 (Screw) ×6 IMPLANT
SCREW LOCK 18X2.7X HEXALOBE (Screw) IMPLANT
SCREW LOCK 40X3.5X HEXALOBE (Screw) IMPLANT
SCREW LOCK 55X3.5X HEXALOBE (Screw) IMPLANT
SCREW LOCKING 2.7X18MM (Screw) ×3 IMPLANT
SCREW LOCKING 3.5X40 (Screw) ×3 IMPLANT
SCREW LOCKING 3.5X55 (Screw) ×3 IMPLANT
SCREW NON LOCK 3.5X40 (Screw) ×2 IMPLANT
SCREW NON LOCKING HEX 3.5X22 (Screw) ×2 IMPLANT
SCREW NON LOCKING HEX 3.5X24 (Screw) ×2 IMPLANT
SCREW NON LOCKING HEX 3.5X30 (Screw) ×2 IMPLANT
SCREW NON LOCKING HEX 3.5X45 (Screw) ×2 IMPLANT
SPONGE GAUZE 4X4 12PLY (GAUZE/BANDAGES/DRESSINGS) ×3 IMPLANT
STRIP CLOSURE SKIN 1/2X4 (GAUZE/BANDAGES/DRESSINGS) ×2 IMPLANT
SUT ETHILON 3 0 PS 1 (SUTURE) ×6 IMPLANT
SUT MNCRL AB 4-0 PS2 18 (SUTURE) ×3 IMPLANT
SUT VIC AB 0 CT1 27 (SUTURE) ×9
SUT VIC AB 0 CT1 27XBRD ANTBC (SUTURE) ×2 IMPLANT
SUT VIC AB 2-0 CT1 27 (SUTURE) ×6
SUT VIC AB 2-0 CT1 TAPERPNT 27 (SUTURE) ×1 IMPLANT
TOWEL OR 17X26 10 PK STRL BLUE (TOWEL DISPOSABLE) ×6 IMPLANT
TOWEL OR NON WOVEN STRL DISP B (DISPOSABLE) ×3 IMPLANT
UNDERPAD 30X30 INCONTINENT (UNDERPADS AND DIAPERS) ×1 IMPLANT
WATER STERILE IRR 1500ML POUR (IV SOLUTION) ×1 IMPLANT

## 2013-03-03 NOTE — Anesthesia Postprocedure Evaluation (Signed)
  Anesthesia Post-op Note  Patient: Jillian Campbell  Procedure(s) Performed: Procedure(s) (LRB): OPEN REDUCTION INTERNAL FIXATION (ORIF) DISTAL HUMERUS FRACTURE (Right)  Patient Location: PACU  Anesthesia Type: General  Level of Consciousness: awake and alert   Airway and Oxygen Therapy: Patient Spontanous Breathing  Post-op Pain: mild  Post-op Assessment: Post-op Vital signs reviewed, Patient's Cardiovascular Status Stable, Respiratory Function Stable, Patent Airway and No signs of Nausea or vomiting  Last Vitals:  Filed Vitals:   03/03/13 1127  BP: 121/62  Pulse: 68  Temp: 36.6 C  Resp: 16    Post-op Vital Signs: stable   Complications: No apparent anesthesia complications

## 2013-03-03 NOTE — Transfer of Care (Signed)
Immediate Anesthesia Transfer of Care Note  Patient: Jillian EhlersKimiko Campbell  Procedure(s) Performed: Procedure(s): OPEN REDUCTION INTERNAL FIXATION (ORIF) DISTAL HUMERUS FRACTURE (Right)  Patient Location: PACU  Anesthesia Type:General  Level of Consciousness: awake and oriented  Airway & Oxygen Therapy: Patient Spontanous Breathing and Patient connected to face mask oxygen  Post-op Assessment: Report given to PACU RN and Post -op Vital signs reviewed and stable  Post vital signs: Reviewed and stable  Complications: No apparent anesthesia complications

## 2013-03-03 NOTE — Anesthesia Preprocedure Evaluation (Addendum)
Anesthesia Evaluation  Patient identified by MRN, date of birth, ID band Patient awake    Reviewed: Allergy & Precautions, H&P , NPO status , Patient's Chart, lab work & pertinent test results, reviewed documented beta blocker date and time   Airway Mallampati: II TM Distance: >3 FB Neck ROM: full    Dental  (+) Edentulous Lower, Dental Advisory Given   Pulmonary neg pulmonary ROS, former smoker,  breath sounds clear to auscultation  Pulmonary exam normal       Cardiovascular Exercise Tolerance: Good hypertension, Pt. on home beta blockers Rhythm:regular Rate:Normal     Neuro/Psych negative neurological ROS  negative psych ROS   GI/Hepatic negative GI ROS, Neg liver ROS,   Endo/Other  negative endocrine ROS  Renal/GU negative Renal ROS  negative genitourinary   Musculoskeletal   Abdominal   Peds  Hematology negative hematology ROS (+)   Anesthesia Other Findings   Reproductive/Obstetrics negative OB ROS                          Anesthesia Physical Anesthesia Plan  ASA: II  Anesthesia Plan: General   Post-op Pain Management:    Induction: Intravenous  Airway Management Planned: LMA  Additional Equipment:   Intra-op Plan:   Post-operative Plan:   Informed Consent: I have reviewed the patients History and Physical, chart, labs and discussed the procedure including the risks, benefits and alternatives for the proposed anesthesia with the patient or authorized representative who has indicated his/her understanding and acceptance.   Dental Advisory Given  Plan Discussed with: CRNA and Surgeon  Anesthesia Plan Comments:         Anesthesia Quick Evaluation

## 2013-03-03 NOTE — Op Note (Signed)
NAMESEJAL, Jillian [name]:  [id number]  MEDICAL RECORD NO.:  [id number]  LOCATION:  1607                         FACILITY:  [facility]  PHYSICIAN:  Vanita Panda. Magnus Ivan, M.D.DATE OF BIRTH:  08-05-1930  DATE OF PROCEDURE:  03/03/2013 DATE OF DISCHARGE:                              OPERATIVE REPORT   PREOPERATIVE DIAGNOSIS:  Right extra-articular, but transcondylar distal humerus fracture.  POSTOPERATIVE DIAGNOSIS:  Right extra-articular, but transcondylar distal humerus fracture.  PROCEDURE:  Open reduction and internal fixation of right distal humerus fracture including olecranon osteotomy.  SURGEON:  Vanita Panda. Magnus Ivan, M.D.  ASSISTANT:  Richardean Canal, P.A.  ANESTHESIA:  General.  BLOOD LOSS:  Less than 100 mL.  TOURNIQUET TIME:  Less than 2 hours.  COMPLICATIONS:  None.  INDICATIONS:  Jillian Campbell is an 80 year old female who 2 days ago slipped on the ice at her home injuring her right elbow.  After bruising, swelling, and pain, she came to the emergency room last evening and was found to have an extra-articular, but quite distal, distal humerus fracture of her right arm.  It was significantly displaced and I recommended that she undergo open reduction and internal fixation of this fracture.  Given the distal nature of the fracture, I also recommend that we would need to perform an olecranon osteotomy to get it reduced as much as possible.  She is certainly elderly and her husband understands the need for this surgery as well as the risk of infection and blood loss, nerve and vessel injury, nonunion.  PROCEDURE DESCRIPTION:  After informed consent was obtained, appropriate right arm was marked.  She was brought to the operating room, placed supine on the operating table.  General anesthesia was then obtained. She was then turned into a lateral position with the right operative arm up and across the front over there was an axillary roll in place  and appropriate padding of her legs, neck, head, and down on operative arm. Her right arm was then prepped and draped from the axillary area down to the wrist with DuraPrep and sterile drapes including sterile stockinette.  We then placed a sterile tourniquet.  Time-out was called and she was identified as correct patient, correct right arm.  I then used an Esmarch to wrap out the arm and tourniquet was inflated to 250 mmHg.  I then made a midline incision on the posterior aspect of the elbow and carried this proximally and distally.  I dissected down to the elbow itself.  I then was able to see that the triceps was torn significantly in terms of the muscle belly and I could easily identify the fracture.  It was quite distal so I felt I would need to do perform olecranon osteotomy.  I first identified the ulnar nerve and transposed the nerve.  I then used an oscillating saw and made olecranon osteotomy and brought this superior it to it and identified the fracture.  I cleaned the fracture of debris and was able to temporarily hold it in place with K-wires and reduction forceps.  I then used Acumed distal humeral locking plates and place a plate direct medial and another one posterolateral to secure  the fracture in a reduced position.  We then repaired our olecranon osteotomy using Acumed olecranon plate as well. I put the elbow through range of motion and was able to find the fracture adequately reduced and good motion of the elbow with no blocks to this.  I then verified the placement of screws under direct fluoroscopy.  We then irrigated the soft tissues with normal saline solution and closed the deep tissue over the plate with 0-Vicryl followed by 2-0 Vicryl in the subcutaneous tissue, interrupted 3-0 nylon on the skin.  Xeroform and well-padded sterile dressing was applied.  We let the tourniquet down with the fingers pink and nicely.  She was awakened, extubated, and taken to the  recovery room in stable condition. All final counts were correct.  There were no complications noted.  Postoperatively, she will be returned to the floor for IV antibiotics and pain medicine with treatment in a sling and hopefully discharged in the next 24 hours.  Of note, [name], PA-C assisted during the entire case and his assistance was crucial for helping hold the fracture reduced while we plated the fracture and layered closure of the wound.     Vanita Pandahristopher Y. Magnus IvanBlackman, M.D.     CYB/MEDQ  D:  03/03/2013  T:  03/03/2013  Job:  [id number]

## 2013-03-03 NOTE — H&P (Signed)
  She and her husband fully understand that we are proceeding to surgery this am for fixation of her right distal humerus fracture.

## 2013-03-03 NOTE — Brief Op Note (Signed)
03/02/2013 - 03/03/2013  10:39 AM  PATIENT:  Neta EhlersKimiko Haste  80 y.o. female  PRE-OPERATIVE DIAGNOSIS:  Right distal humerus fracture  POST-OPERATIVE DIAGNOSIS:  Right distal humerus fracture  PROCEDURE:  Procedure(s): OPEN REDUCTION INTERNAL FIXATION (ORIF) DISTAL HUMERUS FRACTURE (Right)  SURGEON:  Surgeon(s) and Role:    * Kathryne Hitchhristopher Y Zarria Towell, MD - Primary  PHYSICIAN ASSISTANT: Rexene EdisonGil Clark, PA-C  ANESTHESIA:   general  EBL:  Total I/O In: 300 [I.V.:300] Out: -   BLOOD ADMINISTERED:none  DRAINS: none   LOCAL MEDICATIONS USED:  NONE  SPECIMEN:  No Specimen  DISPOSITION OF SPECIMEN:  N/A  COUNTS:  YES  TOURNIQUET:   Total Tourniquet Time Documented: Upper Arm (Right) - 118 minutes Total: Upper Arm (Right) - 118 minutes   DICTATION: .Other Dictation: Dictation Number [id number]  PLAN OF CARE: Admit to inpatient   PATIENT DISPOSITION:  PACU - hemodynamically stable.   Delay start of Pharmacological VTE agent (>24hrs) due to surgical blood loss or risk of bleeding: not applicable

## 2013-03-03 NOTE — Progress Notes (Signed)
Patient ambulated to bathroom. Very unsteady and impulsive. Sat on edge of bed for two minutes prior to standing and complained of dizziness during walk to bathroom. Gildardo CrankerAnita Priddy, RN

## 2013-03-04 MED ORDER — POLYETHYLENE GLYCOL 3350 17 G PO PACK
17.0000 g | PACK | Freq: Once | ORAL | Status: AC
  Administered 2013-03-04: 17 g via ORAL

## 2013-03-04 NOTE — Evaluation (Signed)
Physical Therapy Evaluation Patient Details Name: Jillian Campbell MRN: [id number] DOB: 11-07-1930 Today's Date: 03/04/2013 Time: 0950-1030 PT Time Calculation (min): 40 min  PT Assessment / Plan / Recommendation History of Present Illness  fall with distal humerus fx s/p ORIF  Clinical Impression  Pt s/p R humerus fx presents with inability to use R UE and associated pain and balance deficits limiting functional mobility.  This date pt ambulating in hall with marked improvement in stability utilizing [facility] but still requiring min to min guard assist.  Pt's husband present during session and states he is comfortable assisting her at home.  [facility] requested and discussed use of gait belt with spouse.      PT Assessment  Patient needs continued PT services    Follow Up Recommendations  No PT follow up (spouse declines HHPT)    Does the patient have the potential to tolerate intense rehabilitation      Barriers to Discharge        Equipment Recommendations  Other (comment) (Small base quad cane)    Recommendations for Other Services OT consult   Frequency Min 6X/week    Precautions / Restrictions Precautions Precautions: Fall Required Braces or Orthoses: Sling Restrictions Weight Bearing Restrictions: Yes RUE Weight Bearing: Non weight bearing   Pertinent Vitals/Pain 4/10;       Mobility  Bed Mobility Overal bed mobility: Needs Assistance General bed mobility comments: husband has been assisting Transfers Overall transfer level: Needs assistance Equipment used: Quad cane Transfers: Sit to/from Stand Sit to Stand: Min assist General transfer comment: cues for transition position and hand placement to self assist Ambulation/Gait Ambulation/Gait assistance: Min assist Ambulation Distance (Feet): 200 Feet Assistive device: Straight cane;Quad cane;1 person hand held assist Gait Pattern/deviations: Step-to pattern;Decreased step length - right;Decreased step length -  left;Shuffle;Trunk flexed Gait velocity: decr General Gait Details: cues for sequence, cane placement and posture.  Pt ambulated 20' HHA, 69' with SPC and 100' with SBQC.  Increased stability with SPC and Marked improvement in stability with use of [facility]    Exercises     PT Diagnosis: Difficulty walking  PT Problem List: Decreased activity tolerance;Decreased balance;Decreased mobility;Decreased knowledge of use of DME;Pain PT Treatment Interventions: DME instruction;Gait training;Stair training;Functional mobility training;Therapeutic activities;Therapeutic exercise;Patient/family education     PT Goals(Current goals can be found in the care plan section) Acute Rehab PT Goals Patient Stated Goal: to go home PT Goal Formulation: With patient Time For Goal Achievement: 03/09/13 Potential to Achieve Goals: Good  Visit Information  Last PT Received On: 03/04/13 Assistance Needed: +1 History of Present Illness: fall with distal humerus fx s/p ORIF       Prior Functioning  Home Living Family/patient expects to be discharged to:: Private residence Living Arrangements: Spouse/significant other Available Help at Discharge: Family;Available 24 hours/day Type of Home: House Home Access: Stairs to enter Entrance Stairs-Number of Steps: 1 (Threshold) Entrance Stairs-Rails: None Home Layout: One level Home Equipment: None Prior Function Level of Independence: Independent Communication Communication: No difficulties Dominant Hand: Right    Cognition  Cognition Arousal/Alertness: Awake/alert Behavior During Therapy: WFL for tasks assessed/performed Overall Cognitive Status: Within Functional Limits for tasks assessed    Extremity/Trunk Assessment Upper Extremity Assessment Upper Extremity Assessment: RUE deficits/detail RUE Deficits / Details: no orders for ROM as of yet so kept immobilized in sling Lower Extremity Assessment Lower Extremity Assessment: Defer to PT evaluation    Balance    End of Session PT - End of Session Equipment Utilized During  Treatment: Gait belt Activity Tolerance: Patient tolerated treatment well Patient left: Other (comment) (at bathroom door with OT) Nurse Communication: Mobility status;Other (comment)  GP Functional Assessment Tool Used: clinical judgement Functional Limitation: Mobility: Walking and moving around Mobility: Walking and Moving Around Current Status [phone](G8978): At least 20 percent but less than 40 percent impaired, limited or restricted Mobility: Walking and Moving Around Goal Status [phone](G8979): At least 1 percent but less than 20 percent impaired, limited or restricted   [facility] Jude Medical CenterBRADSHAW,Griffith Santilli 03/04/2013, 11:31 AM

## 2013-03-04 NOTE — Progress Notes (Signed)
Subjective: 1 Day Post-Op Procedure(s) (LRB): OPEN REDUCTION INTERNAL FIXATION (ORIF) DISTAL HUMERUS FRACTURE (Right) Patient reports pain as mild.  Slow progress with PT.  Objective: Vital signs in last 24 hours: Temp:  [98.9 F (37.2 C)-101.3 F (38.5 C)] 99 F (37.2 C) (02/22 1448) Pulse Rate:  [69-85] 84 (02/22 1448) Resp:  [16-18] 18 (02/22 1448) BP: (94-132)/(50-63) 106/58 mmHg (02/22 1448) SpO2:  [90 %-95 %] 95 % (02/22 1448)  Intake/Output from previous day: 02/21 0701 - 02/22 0700 In: 1830 [P.O.:240; I.V.:1485; IV Piggyback:105] Out: -  Intake/Output this shift: Total I/O In: 240 [P.O.:240] Out: 200 [Urine:200]   Recent Labs  03/02/13 1638  HGB 11.0*    Recent Labs  03/02/13 1638  WBC 6.8  RBC 3.82*  HCT 34.5*  PLT 210    Recent Labs  03/02/13 1638  NA 137  K 4.2  CL 96  CO2 27  BUN 30*  CREATININE 1.32*  GLUCOSE 135*  CALCIUM 9.1    Recent Labs  03/02/13 1638  INR 1.03    Right Upper Extremity: Dressing clean dry and intact Hand motor and sensation intact Hand perfused    Assessment/Plan: 1 Day Post-Op Procedure(s) (LRB): OPEN REDUCTION INTERNAL FIXATION (ORIF) DISTAL HUMERUS FRACTURE (Right) Up with therapy Plan discharge tomorrow  Richardean CanalCLARK, GILBERT 03/04/2013, 4:27 PM

## 2013-03-04 NOTE — Progress Notes (Signed)
Occupational Therapy Evaluation Patient Details Name: Jillian Campbell MRN: [id number] DOB: [birth year] Today's Date: 03/04/2013 Time: 4098-1191 OT Time Calculation (min): 15 min  OT Assessment / Plan / Recommendation History of present illness fall with distal humerus fx s/p ORIF   Clinical Impression   Patient presents with decreased ADL independence and safety. Will benefit from skilled OT to maximize independence prior to discharge home with husband.    OT Assessment  Patient needs continued OT Services    Follow Up Recommendations  Supervision/Assistance - 24 hour    Barriers to Discharge      Equipment Recommendations  None recommended by OT    Recommendations for Other Services    Frequency  Min 3X/week    Precautions / Restrictions Restrictions Weight Bearing Restrictions: Yes RUE Weight Bearing: Non weight bearing   Pertinent Vitals/Pain No c/o pain    ADL  Eating/Feeding: Performed;Moderate assistance Where Assessed - Eating/Feeding: Edge of bed Grooming: Performed;Wash/dry hands;Minimal assistance Where Assessed - Grooming: Supported standing Upper Body Bathing: Simulated;Minimal assistance Where Assessed - Upper Body Bathing: Unsupported sitting Lower Body Bathing: Simulated;Minimal assistance Where Assessed - Lower Body Bathing: Supported sit to stand Upper Body Dressing: Performed;+1 Total assistance (sling) Where Assessed - Upper Body Dressing: Unsupported sitting Lower Body Dressing: Simulated;Moderate assistance Where Assessed - Lower Body Dressing: Supported sit to Pharmacist, hospital: Performed;Minimal Dentist Method: Sit to stand;Stand pivot Acupuncturist: Regular height toilet Toileting - Clothing Manipulation and Hygiene: Performed;Minimal assistance Where Assessed - Engineer, mining and Hygiene: Sit to stand from 3-in-1 or toilet Transfers/Ambulation Related to ADLs: Patient using Quad cane in left  hand for ambulation and transfers. Shower transfer deferred; educated pt and husband that MD would have to clear pt to shower when indicated. Pt verbalized understanding of sponge bathing for now. ADL Comments: Educated pt and husband regarding loose fitting clothing and button down shirts vs robes due to bulkiness of pt's dressing. Educated pt/husband on sling outside of clothing.     OT Diagnosis: Generalized weakness;Acute pain  OT Problem List: Decreased strength;Impaired balance (sitting and/or standing);Decreased safety awareness;Decreased knowledge of use of DME or AE;Impaired UE functional use OT Treatment Interventions: Self-care/ADL training;DME and/or AE instruction;Therapeutic activities;Patient/family education   OT Goals(Current goals can be found in the care plan section) Acute Rehab OT Goals Patient Stated Goal: to go home OT Goal Formulation: With patient Time For Goal Achievement: 03/18/13 Potential to Achieve Goals: Good  Visit Information  Last OT Received On: 03/04/13 Assistance Needed: +1 History of Present Illness: fall with distal humerus fx s/p ORIF       Prior Functioning     Home Living Family/patient expects to be discharged to:: Private residence Living Arrangements: Spouse/significant other Available Help at Discharge: Family;Available 24 hours/day Home Equipment: None Prior Function Level of Independence: Independent Communication Communication: No difficulties Dominant Hand: Right         Cognition  Cognition Arousal/Alertness: Awake/alert Behavior During Therapy: WFL for tasks assessed/performed Overall Cognitive Status: Within Functional Limits for tasks assessed    Extremity/Trunk Assessment Upper Extremity Assessment Upper Extremity Assessment: RUE deficits/detail RUE Deficits / Details: no orders for ROM as of yet so kept immobilized in sling Lower Extremity Assessment Lower Extremity Assessment: Defer to PT evaluation     End  of Session OT - End of Session Equipment Utilized During Treatment: Gait belt;Other (comment) (quad cane) Activity Tolerance: Patient tolerated treatment well Patient left: in bed;with call bell/phone within reach;with family/visitor present  GO  Functional Limitation: Self care Self Care Current Status [phone]): At least 40 percent but less than 60 percent impaired, limited or restricted Self Care Goal Status ([id number](G8988): At least 1 percent but less than 20 percent impaired, limited or restricted   Jillian Campbell A 03/04/2013, 11:01 AM

## 2013-03-04 NOTE — Progress Notes (Signed)
   CARE MANAGEMENT NOTE 03/04/2013  Patient:  Neta EhlersBRAM,Starr   Account Number:  [id number]  Date Initiated:  03/03/2013  Documentation initiated by:  [facility] HospitalHAVIS,Ronte Parker  Subjective/Objective Assessment:   mech fall, OPEN REDUCTION INTERNAL FIXATION (ORIF) DISTAL HUMERUS FRACTURE     Action/Plan:   HH   Anticipated DC Date:  03/05/2013   Anticipated DC Plan:  HOME W HOME HEALTH SERVICES      DC Planning Services  CM consult      [facility]   Choice offered to / List presented to:  C-3 Spouse        HH arranged  HH-2 PT  HH-3 OT      [facility]  [facility]   Status of service:  Completed, signed off Medicare Important Message given?   (If response is "NO", the following Medicare IM given date fields will be blank) Date Medicare IM given:   Date Additional Medicare IM given:    Discharge Disposition:    Per UR Regulation:    If discussed at Long Length of Stay Meetings, dates discussed:    Comments:  03/04/2013 1530 NCM spoke to pt and gave permission to speak to husband or dtr, Barbaraann BarthelKimberly Beal # [phone]. Spoke to [facility] and [facility]. States cane will work better for pt at home. Has cane in the room. [facility] and [facility]. Attempted call to dtr, Barbaraann BarthelKimberly Beal. Left message on voice mail. Isidoro DonningAlesia Zulay Corrie RN CCM Case Mgmt phone [phone]

## 2013-03-05 ENCOUNTER — Encounter (HOSPITAL_COMMUNITY): Payer: Self-pay | Admitting: Orthopaedic Surgery

## 2013-03-05 MED ORDER — HYDROCODONE-ACETAMINOPHEN 5-325 MG PO TABS: 1.0000 | ORAL_TABLET | ORAL | Status: DC | PRN

## 2013-03-05 NOTE — Progress Notes (Signed)
Subjective: 2 Days Post-Op Procedure(s) (LRB): OPEN REDUCTION INTERNAL FIXATION (ORIF) DISTAL HUMERUS FRACTURE (Right) Patient reports pain as mild.    Objective: Vital signs in last 24 hours: Temp:  [97.9 F (36.6 C)-100.9 F (38.3 C)] 97.9 F (36.6 C) (02/23 0520) Pulse Rate:  [69-89] 89 (02/23 0520) Resp:  [16-18] 16 (02/23 0520) BP: (94-127)/(50-63) 100/56 mmHg (02/23 0520) SpO2:  [91 %-95 %] 92 % (02/23 0520)  Intake/Output from previous day: 02/22 0701 - 02/23 0700 In: 1549.8 [P.O.:720; I.V.:829.8] Out: 350 [Urine:350] Intake/Output this shift:     Recent Labs  03/02/13 1638  HGB 11.0*    Recent Labs  03/02/13 1638  WBC 6.8  RBC 3.82*  HCT 34.5*  PLT 210    Recent Labs  03/02/13 1638  NA 137  K 4.2  CL 96  CO2 27  BUN 30*  CREATININE 1.32*  GLUCOSE 135*  CALCIUM 9.1    Recent Labs  03/02/13 1638  INR 1.03    Sensation intact distally Intact pulses distally Incision: dressing C/D/I  Assessment/Plan: 2 Days Post-Op Procedure(s) (LRB): OPEN REDUCTION INTERNAL FIXATION (ORIF) DISTAL HUMERUS FRACTURE (Right) Discharge home with home health  Kathryne HitchBLACKMAN,Drake Wuertz Y 03/05/2013, 7:25 AM

## 2013-03-05 NOTE — Discharge Summary (Signed)
Patient ID: Jillian Campbell MRN: [id number] DOB/AGE: Oct 16, 1930 80 y.o.  Admit date: 03/02/2013 Discharge date: 03/05/2013  Admission Diagnoses:  Active Problems:   Humerus distal fracture   Transcondylar fracture of distal humerus   Discharge Diagnoses:  Same  Past Medical History  Diagnosis Date  . Hypertension     Surgeries: Procedure(s): OPEN REDUCTION INTERNAL FIXATION (ORIF) DISTAL HUMERUS FRACTURE on 03/02/2013 - 03/03/2013   Consultants:    Discharged Condition: Improved  Hospital Course: Jillian Campbell is an 80 y.o. female who was admitted 03/02/2013 for operative treatment of<principal problem not specified>. Patient has severe unremitting pain that affects sleep, daily activities, and work/hobbies. After pre-op clearance the patient was taken to the operating room on 03/02/2013 - 03/03/2013 and underwent  Procedure(s): OPEN REDUCTION INTERNAL FIXATION (ORIF) DISTAL HUMERUS FRACTURE.    Patient was given perioperative antibiotics: Anti-infectives   Start     Dose/Rate Route Frequency Ordered Stop   03/03/13 1400  ceFAZolin (ANCEF) IVPB 1 g/50 mL premix     1 g 100 mL/hr over 30 Minutes Intravenous Every 6 hours 03/03/13 1134 03/04/13 0139   03/03/13 0800  ceFAZolin (ANCEF) IVPB 2 g/50 mL premix     2 g 100 mL/hr over 30 Minutes Intravenous  Once 03/03/13 0719 03/03/13 0800       Patient was given sequential compression devices, early ambulation, and chemoprophylaxis to prevent DVT.  Patient benefited maximally from hospital stay and there were no complications.    Recent vital signs: Patient Vitals for the past 24 hrs:  BP Temp Temp src Pulse Resp SpO2  03/05/13 0520 100/56 mmHg 97.9 F (36.6 C) Oral 89 16 92 %  03/04/13 2152 127/63 mmHg 100.9 F (38.3 C) Oral 86 16 91 %  03/04/13 1448 106/58 mmHg 99 F (37.2 C) Oral 84 18 95 %  03/04/13 0911 108/51 mmHg - - 76 - -  03/04/13 0847 94/50 mmHg 99.6 F (37.6 C) Oral 69 - 95 %     Recent laboratory studies:   Recent Labs  03/02/13 1638  WBC 6.8  HGB 11.0*  HCT 34.5*  PLT 210  NA 137  K 4.2  CL 96  CO2 27  BUN 30*  CREATININE 1.32*  GLUCOSE 135*  INR 1.03  CALCIUM 9.1     Discharge Medications:     Medication List    STOP taking these medications       traMADol 50 MG tablet  Commonly known as:  ULTRAM      TAKE these medications       amLODipine 2.5 MG tablet  Commonly known as:  NORVASC  Take 2.5 mg by mouth daily.     aspirin 81 MG chewable tablet  Chew 81 mg by mouth 2 (two) times daily.     atenolol 25 MG tablet  Commonly known as:  TENORMIN  Take 25 mg by mouth daily.     HYDROcodone-acetaminophen 5-325 MG per tablet  Commonly known as:  NORCO/VICODIN  Take 1-2 tablets by mouth every 4 (four) hours as needed for moderate pain.     lisinopril 20 MG tablet  Commonly known as:  PRINIVIL,ZESTRIL  Take 20 mg by mouth daily.     multivitamin with minerals Tabs tablet  Take 1 tablet by mouth daily.     simvastatin 20 MG tablet  Commonly known as:  ZOCOR  Take 20 mg by mouth daily.     triamcinolone cream 0.1 %  Commonly known as:  KENALOG  Apply 1 application topically 2 (two) times daily.     VITAMIN D PO  Take 1 tablet by mouth 2 (two) times daily.        Diagnostic Studies: Dg Elbow 2 Views Right  03/18/2013   CLINICAL DATA:  Larey Seat and injured right arm. Bruising throughout the arm.  EXAM: RIGHT ELBOW - 2 VIEW  COMPARISON:  Right forearm x-rays obtained concurrently.  FINDINGS: Comminuted transverse fracture involving the supracondylar humerus. Humeroulnar joint and humeroradial joint intact with moderate joint space narrowing. No other visible fractures about the elbow. Large joint effusion/hemarthrosis. Diffuse soft tissue swelling.  IMPRESSION: Comminuted transverse fracture involving the supracondylar humerus. Elbow joint intact with degenerative changes.   Electronically Signed   By: Hulan Saas M.D.   On: 03/18/2013 18:19   Dg Elbow  Complete Right  03/03/2013   CLINICAL DATA:  ORIF supracondylar humerus fracture.  EXAM: OPERATIVE RIGHT ELBOW - COMPLETE 3+ VIEW  COMPARISON:  DG ELBOW 2 VIEWS*R* dated Mar 18, 2013  FINDINGS: Four spot images from the C-arm fluoroscopic device were submitted for interpretation post operatively. These demonstrate ORIF of the supracondylar humerus fracture with plate and screw fixation. Plate and screw fixation of the olecranon was also performed. Alignment appears anatomic.  IMPRESSION: Anatomic alignment post ORIF of the comminuted supracondylar humerus fracture.   Electronically Signed   By: Hulan Saas M.D.   On: 03/03/2013 18:36   Dg Forearm Right  2013-03-18   CLINICAL DATA:  Fall, elbow pain  EXAM: RIGHT FOREARM - 2 VIEW  COMPARISON:  None.  FINDINGS: There is an acute displaced right distal humerus supracondylar fracture. Diffuse soft tissue swelling noted on the lateral view. Bones are osteopenic. Radius and ulna appear intact.  IMPRESSION: Acute displaced right distal humerus supracondylar fracture at the elbow.   Electronically Signed   By: Ruel Favors M.D.   On: 03/18/13 18:20   Dg C-arm 61-120 Min-no Report  03/03/2013   CLINICAL DATA: ORIF RIGHT DISTAL HUMERUS   C-ARM 61-120 MINUTES  Fluoroscopy was utilized by the requesting physician.  No radiographic  interpretation.     Disposition:  To home      Discharge Orders   Future Orders Complete By Expires   Call MD / Call 911  As directed    Comments:     If you experience chest pain or shortness of breath, CALL 911 and be transported to the hospital emergency room.  If you develope a fever above 101 F, pus (white drainage) or increased drainage or redness at the wound, or calf pain, call your surgeon's office.   Constipation Prevention  As directed    Comments:     Drink plenty of fluids.  Prune juice may be helpful.  You may use a stool softener, such as Colace (over the counter) 100 mg twice a day.  Use MiraLax (over the  counter) for constipation as needed.   Diet - low sodium heart healthy  As directed    Discharge instructions  As directed    Comments:     No heavy lifting with your right arm at all. Wear your sling at most times; but you may come out of your sling to occasionally move your shoulder or arm. You may remove all of your dressings on 03/09/13 and start getting your actual incisions wet in the shower. New dry dressing daily starting 03/09/13.   Discharge patient  As directed    Increase activity slowly as tolerated  As directed  Follow-up Information   Follow up with Kathryne Hitch, MD. Schedule an appointment as soon as possible for a visit in 2 weeks.   Specialty:  Orthopedic Surgery   Contact information:   [address] [place] [address] [phone]        Signed: Kathryne Hitch 03/05/2013, 7:28 AM

## 2013-03-05 NOTE — Progress Notes (Signed)
Occupational Therapy Treatment Patient Details Name: Jillian Campbell MRN: [id number] DOB: April 02, 1930 Today's Date: 03/05/2013 Time: [id number] OT Time Calculation (min): 23 min  OT Assessment / Plan / Recommendation  History of present illness fall with distal humerus fx s/p ORIF   OT comments    Follow Up Recommendations  Supervision/Assistance - 24 hour       Equipment Recommendations  None recommended by OT          Progress towards OT Goals Progress towards OT goals: Progressing toward goals  Plan Discharge plan remains appropriate    Precautions / Restrictions Restrictions Weight Bearing Restrictions: Yes RUE Weight Bearing: Non weight bearing       ADL  ADL Comments: Pts fingers VERY swollen. Educated pt and husband  on elevating RUE to decrease swelling.  Pt and husband verbalized understanding.  MD note also stated it was ok for pt to gently move shoulder. Educated husband on PROM of R shoulder which will also help with edema.  Husband verbalized understanding.  Also encouraged finger ROm which is limited due to swelling   OT Goals(current goals can now be found in the care plan section) Acute Rehab OT Goals Patient Stated Goal: to go home  Visit Information  Last OT Received On: 03/05/13 Assistance Needed: +1 History of Present Illness: fall with distal humerus fx s/p ORIF          Cognition  Cognition Arousal/Alertness: Awake/alert Behavior During Therapy: WFL for tasks assessed/performed Overall Cognitive Status: Within Functional Limits for tasks assessed             End of Session OT - End of Session Activity Tolerance: Patient tolerated treatment well Patient left: in chair  GO Functional Limitation: Self care Self Care Current Status ([id number]): At least 20 percent but less than 40 percent impaired, limited or restricted Self Care Goal Status (M8413(G8988): At least 1 percent but less than 20 percent impaired, limited or restricted Self Care Discharge  Status [phone](G8989): At least 20 percent but less than 40 percent impaired, limited or restricted   Jillian Campbell, Metro KungLorraine D 03/05/2013, 1:34 PM

## 2013-03-06 NOTE — Progress Notes (Signed)
Late entry     CARE MANAGEMENT NOTE 03/06/2013  Patient:  Neta EhlersBRAM,Aizlyn   Account Number:  [id number]  Date Initiated:  03/03/2013  Documentation initiated by:  [facility]Neetu Carrozza  Subjective/Objective Assessment:   mech fall, OPEN REDUCTION INTERNAL FIXATION (ORIF) DISTAL HUMERUS FRACTURE     Action/Plan:   HH   Anticipated DC Date:  03/05/2013   Anticipated DC Plan:  HOME W HOME HEALTH SERVICES      DC Planning Services  CM consult      [facility]   Choice offered to / List presented to:  C-3 Spouse        HH arranged  HH-2 PT  HH-3 OT      [facility]   Status of service:  Completed, signed off Medicare Important Message given?   (If response is "NO", the following Medicare IM given date fields will be blank) Date Medicare IM given:   Date Additional Medicare IM given:    Discharge Disposition:  HOME W HOME HEALTH SERVICES  Per UR Regulation:    If discussed at Long Length of Stay Meetings, dates discussed:    Comments:  03/05/2013 1040 Received call back from dtr and [facility] States she plans to visit her mother soon to assist her father at home. Isidoro DonningAlesia Sheri Prows RN CCM Case Mgmt phone [phone]  03/05/2013 0900 Left message for dtr to return call. Isidoro DonningAlesia Janita Camberos RN CCM Case Mgmt phone [phone]  03/04/2013 1530 NCM spoke to pt and gave permission to speak to husband or dtr, Barbaraann BarthelKimberly Beal # [phone]. Spoke to husband and [facility]. States cane will work better for pt at home. Has cane in the room. [facility] and [facility]. Attempted call to dtr, Barbaraann BarthelKimberly Beal. Left message on voice mail. Isidoro DonningAlesia Ermie Glendenning RN CCM Case Mgmt phone [phone]

## 2013-04-04 ENCOUNTER — Encounter (HOSPITAL_COMMUNITY): Payer: Self-pay | Admitting: General Practice

## 2013-04-04 ENCOUNTER — Inpatient Hospital Stay (HOSPITAL_COMMUNITY)
Admission: AD | Admit: 2013-04-04 | Discharge: 2013-04-10 | DRG: 857 | Disposition: A | Discharge status: Home Health | Payer: Medicare Other | Source: Ambulatory Visit | Attending: Orthopaedic Surgery | Admitting: Orthopaedic Surgery

## 2013-04-04 ENCOUNTER — Inpatient Hospital Stay (HOSPITAL_COMMUNITY): Payer: Medicare Other

## 2013-04-04 DIAGNOSIS — Y838 Other surgical procedures as the cause of abnormal reaction of the patient, or of later complication, without mention of misadventure at the time of the procedure: Secondary | ICD-10-CM | POA: Diagnosis present

## 2013-04-04 DIAGNOSIS — Z87891 Personal history of nicotine dependence: Secondary | ICD-10-CM

## 2013-04-04 DIAGNOSIS — T8140XA Infection following a procedure, unspecified, initial encounter: Principal | ICD-10-CM | POA: Diagnosis present

## 2013-04-04 DIAGNOSIS — Y92009 Unspecified place in unspecified non-institutional (private) residence as the place of occurrence of the external cause: Secondary | ICD-10-CM

## 2013-04-04 DIAGNOSIS — I10 Essential (primary) hypertension: Secondary | ICD-10-CM | POA: Diagnosis present

## 2013-04-04 DIAGNOSIS — IMO0002 Reserved for concepts with insufficient information to code with codable children: Secondary | ICD-10-CM | POA: Diagnosis present

## 2013-04-04 DIAGNOSIS — Z7982 Long term (current) use of aspirin: Secondary | ICD-10-CM | POA: Diagnosis not present

## 2013-04-04 DIAGNOSIS — M25529 Pain in unspecified elbow: Secondary | ICD-10-CM | POA: Diagnosis present

## 2013-04-04 DIAGNOSIS — M009 Pyogenic arthritis, unspecified: Secondary | ICD-10-CM

## 2013-04-04 DIAGNOSIS — T8130XA Disruption of wound, unspecified, initial encounter: Secondary | ICD-10-CM | POA: Diagnosis present

## 2013-04-04 HISTORY — DX: Unspecified osteoarthritis, unspecified site: M19.90

## 2013-04-04 HISTORY — DX: Peptic ulcer, site unspecified, unspecified as acute or chronic, without hemorrhage or perforation: K27.9

## 2013-04-04 HISTORY — DX: Pure hypercholesterolemia, unspecified: E78.00

## 2013-04-04 LAB — BASIC METABOLIC PANEL
BUN: 23 mg/dL (ref 6–23)
CALCIUM: 8.5 mg/dL (ref 8.4–10.5)
CO2: 22 meq/L (ref 19–32)
Chloride: 97 mEq/L (ref 96–112)
Creatinine, Ser: 1.16 mg/dL — ABNORMAL HIGH (ref 0.50–1.10)
GFR calc non Af Amer: 43 mL/min — ABNORMAL LOW (ref >90)
GFR, EST AFRICAN AMERICAN: 50 mL/min — AB (ref >90)
Glucose, Bld: 205 mg/dL — ABNORMAL HIGH (ref 70–99)
Potassium: 4.3 mEq/L (ref 3.7–5.3)
SODIUM: 133 meq/L — AB (ref 137–147)

## 2013-04-04 LAB — CBC
HCT: 30.1 % — ABNORMAL LOW (ref 36.0–46.0)
Hemoglobin: 9.9 g/dL — ABNORMAL LOW (ref 12.0–15.0)
MCH: 29.8 pg (ref 26.0–34.0)
MCHC: 32.9 g/dL (ref 30.0–36.0)
MCV: 90.7 fL (ref 78.0–100.0)
PLATELETS: 176 10*3/uL (ref 150–400)
RBC: 3.32 MIL/uL — ABNORMAL LOW (ref 3.87–5.11)
RDW: 13.1 % (ref 11.5–15.5)
WBC: 8.4 10*3/uL (ref 4.0–10.5)

## 2013-04-04 LAB — SEDIMENTATION RATE: SED RATE: 81 mm/h — AB (ref 0–22)

## 2013-04-04 MED ORDER — SODIUM CHLORIDE 0.9 % IV SOLN
INTRAVENOUS | Status: DC
  Administered 2013-04-04: 10 mL/h via INTRAVENOUS

## 2013-04-04 MED ORDER — PIPERACILLIN-TAZOBACTAM 3.375 G IVPB
3.3750 g | Freq: Three times a day (TID) | INTRAVENOUS | Status: DC
  Administered 2013-04-05 (×2): 3.375 g via INTRAVENOUS
  Filled 2013-04-04 (×4): qty 50

## 2013-04-04 MED ORDER — PIPERACILLIN-TAZOBACTAM 3.375 G IVPB 30 MIN
3.3750 g | INTRAVENOUS | Status: DC
  Filled 2013-04-04: qty 50

## 2013-04-04 MED ORDER — VANCOMYCIN HCL IN DEXTROSE 1-5 GM/200ML-% IV SOLN
1000.0000 mg | Freq: Once | INTRAVENOUS | Status: AC
  Administered 2013-04-04: 1000 mg via INTRAVENOUS
  Filled 2013-04-04: qty 200

## 2013-04-04 MED ORDER — OXYCODONE HCL 5 MG PO TABS
5.0000 mg | ORAL_TABLET | ORAL | Status: DC | PRN
  Administered 2013-04-05: 5 mg via ORAL

## 2013-04-04 MED ORDER — HYDROCODONE-ACETAMINOPHEN 5-325 MG PO TABS
1.0000 | ORAL_TABLET | ORAL | Status: DC | PRN
  Administered 2013-04-04 – 2013-04-10 (×16): 2 via ORAL
  Filled 2013-04-04 (×4): qty 2
  Filled 2013-04-04: qty 1
  Filled 2013-04-04 (×7): qty 2
  Filled 2013-04-04: qty 1
  Filled 2013-04-04 (×4): qty 2

## 2013-04-04 MED ORDER — PIPERACILLIN-TAZOBACTAM 3.375 G IVPB 30 MIN
3.3750 g | Freq: Once | INTRAVENOUS | Status: AC
  Administered 2013-04-04: 3.375 g via INTRAVENOUS
  Filled 2013-04-04: qty 50

## 2013-04-04 MED ORDER — VANCOMYCIN HCL 500 MG IV SOLR
500.0000 mg | Freq: Two times a day (BID) | INTRAVENOUS | Status: DC
  Administered 2013-04-05 – 2013-04-07 (×5): 500 mg via INTRAVENOUS
  Filled 2013-04-04 (×7): qty 500

## 2013-04-04 MED ORDER — ONDANSETRON HCL 4 MG/2ML IJ SOLN: 4.0000 mg | Freq: Four times a day (QID) | INTRAMUSCULAR | Status: DC | PRN

## 2013-04-04 MED ORDER — DIPHENHYDRAMINE HCL 50 MG/ML IJ SOLN
25.0000 mg | Freq: Four times a day (QID) | INTRAMUSCULAR | Status: DC | PRN
Start: 2013-04-04 — End: 2013-04-10

## 2013-04-04 MED ORDER — VANCOMYCIN HCL IN DEXTROSE 1-5 GM/200ML-% IV SOLN
1000.0000 mg | INTRAVENOUS | Status: DC
  Filled 2013-04-04: qty 200

## 2013-04-04 NOTE — Progress Notes (Signed)
ANTIBIOTIC CONSULT NOTE - INITIAL  Pharmacy Consult for Zosyn and Vancomycin Indication: cellulitis  No Known Allergies  Patient Measurements:   Ht: 60 in    Wt: 66 kg Vital Signs: Temp: 98.6 F (37 C) (03/25 1649) BP: 121/44 mmHg (03/25 1649) Pulse Rate: 81 (03/25 1649) Intake/Output from previous day:   Intake/Output from this shift:    Labs: No results found for this basename: WBC, HGB, PLT, LABCREA, CREATININE,  in the last 72 hours The CrCl is unknown because both a height and weight (above a minimum accepted value) are required for this calculation. No results found for this basename: VANCOTROUGH, VANCOPEAK, VANCORANDOM, GENTTROUGH, GENTPEAK, GENTRANDOM, TOBRATROUGH, TOBRAPEAK, TOBRARND, AMIKACINPEAK, AMIKACINTROU, AMIKACIN,  in the last 72 hours   Microbiology: No results found for this or any previous visit (from the past 720 hour(s)).  Medical History: Past Medical History  Diagnosis Date  . Hypertension     Medications:  Prescriptions prior to admission  Medication Sig Dispense Refill  . amLODipine (NORVASC) 2.5 MG tablet Take 2.5 mg by mouth daily.      Marland Kitchen. aspirin 81 MG chewable tablet Chew 81 mg by mouth 2 (two) times daily.      Marland Kitchen. atenolol (TENORMIN) 25 MG tablet Take 25 mg by mouth daily.      . Cholecalciferol (VITAMIN D PO) Take 1 tablet by mouth 2 (two) times daily.      Marland Kitchen. HYDROcodone-acetaminophen (NORCO/VICODIN) 5-325 MG per tablet Take 1-2 tablets by mouth every 4 (four) hours as needed for moderate pain.  30 tablet  0  . lisinopril (PRINIVIL,ZESTRIL) 20 MG tablet Take 20 mg by mouth daily.      . Multiple Vitamin (MULTIVITAMIN WITH MINERALS) TABS tablet Take 1 tablet by mouth daily.      . simvastatin (ZOCOR) 20 MG tablet Take 20 mg by mouth daily.      Marland Kitchen. triamcinolone cream (KENALOG) 0.1 % Apply 1 application topically 2 (two) times daily.       Assessment: 80 y.o. female presents with questionable R elbow post-op infection. Pt is 3 weeks s/p ORIF  for complex distal humerus fracture. Plan to OR tomorrow for I&D.  SCr 1.16, est CrCl 40-45 ml/min  Goal of Therapy:  Vancomycin trough 15-20 mcg/ml  Plan:  1) Zosyn 3.375gm IV q8h. Each dose over 4 hours 2) Vancomycin 1gm IV once then 500mg  IV q12h. 3) Will f/u renal function, micro data, pt's clinical condition  Christoper Fabianaron Mate Alegria, PharmD, BCPS Clinical pharmacist, pager [phone] 04/04/2013,5:14 PM

## 2013-04-04 NOTE — H&P (Signed)
Jillian Campbell is an 80 y.o. female.   Chief Complaint:  Right elbow questionable post-op infection HPI:   80 yo female who is over three weeks post ORIF of a complex right distal humerus fracture.  She presented to the office today with right elbow redness, swelling, and some breakdown of her incision.  She is being admitted out of concern for a deep infection.  Past Medical History  Diagnosis Date  . Hypertension     Past Surgical History  Procedure Laterality Date  . No past surgeries    . Open reduction internal fixation (orif) distal radial fracture Right 03/03/2013    Procedure: OPEN REDUCTION INTERNAL FIXATION (ORIF) DISTAL HUMERUS FRACTURE;  Surgeon: Kathryne Hitch, MD;  Location: [facility];  Service: Orthopedics;  Laterality: Right;    No family history on file. Social History:  reports that she quit smoking about 45 years ago. Her smoking use included Cigarettes. She has a 20 pack-year smoking history. She has never used smokeless tobacco. She reports that she does not drink alcohol or use illicit drugs.  Allergies: No Known Allergies  Medications Prior to Admission  Medication Sig Dispense Refill  . amLODipine (NORVASC) 2.5 MG tablet Take 2.5 mg by mouth daily.      Marland Kitchen aspirin 81 MG chewable tablet Chew 81 mg by mouth 2 (two) times daily.      Marland Kitchen atenolol (TENORMIN) 25 MG tablet Take 25 mg by mouth daily.      . Cholecalciferol (VITAMIN D PO) Take 1 tablet by mouth 2 (two) times daily.      Marland Kitchen HYDROcodone-acetaminophen (NORCO/VICODIN) 5-325 MG per tablet Take 1-2 tablets by mouth every 4 (four) hours as needed for moderate pain.  30 tablet  0  . lisinopril (PRINIVIL,ZESTRIL) 20 MG tablet Take 20 mg by mouth daily.      . Multiple Vitamin (MULTIVITAMIN WITH MINERALS) TABS tablet Take 1 tablet by mouth daily.      . simvastatin (ZOCOR) 20 MG tablet Take 20 mg by mouth daily.      Marland Kitchen triamcinolone cream (KENALOG) 0.1 % Apply 1 application topically 2 (two) times daily.         No results found for this or any previous visit (from the past 48 hour(s)). No results found.  Review of Systems  All other systems reviewed and are negative.    Blood pressure 121/44, pulse 81, temperature 98.6 F (37 C), resp. rate 20, SpO2 98.00%. Physical Exam  Constitutional: She is oriented to person, place, and time. She appears well-developed and well-nourished.  HENT:  Head: Normocephalic and atraumatic.  Eyes: EOM are normal. Pupils are equal, round, and reactive to light.  Neck: Normal range of motion. Neck supple.  Cardiovascular: Normal rate and regular rhythm.   Respiratory: Effort normal and breath sounds normal.  GI: Soft. Bowel sounds are normal.  Musculoskeletal:       Right elbow: She exhibits swelling and effusion.       Arms: Neurological: She is alert and oriented to person, place, and time.  Skin: Skin is warm and dry.  Psychiatric: She has a normal mood and affect.     Assessment/Plan Right elbow wound infection with cellulitis approximately 3 and a half weeks post ORIF complex distal humerus fracture 1) I aspirated her elbow in the office and got white milky fluid consistent with infection.  The initial gram stain shows no organisms and some WBC's.  I am admitting her and starting IV antibiotics in  an attempt to retain her hardware.  We will plan on proceeding to the OR tomorrow 3/26 for an irrigation and debridement.  Kathryne HitchBLACKMAN,Deedee Lybarger Y 04/04/2013, 5:12 PM

## 2013-04-05 ENCOUNTER — Encounter (HOSPITAL_COMMUNITY)
Admission: AD | Disposition: A | Discharge status: Home Health | Payer: Self-pay | Source: Ambulatory Visit | Attending: Orthopaedic Surgery

## 2013-04-05 ENCOUNTER — Encounter (HOSPITAL_COMMUNITY): Payer: Medicare Other | Admitting: Anesthesiology

## 2013-04-05 ENCOUNTER — Inpatient Hospital Stay (HOSPITAL_COMMUNITY): Payer: Medicare Other | Admitting: Anesthesiology

## 2013-04-05 HISTORY — PX: HARDWARE REMOVAL: SHX979

## 2013-04-05 HISTORY — PX: I&D EXTREMITY: SHX5045

## 2013-04-05 LAB — C-REACTIVE PROTEIN: CRP: 15.4 mg/dL — ABNORMAL HIGH (ref <0.60)

## 2013-04-05 LAB — SURGICAL PCR SCREEN
MRSA, PCR: NEGATIVE
Staphylococcus aureus: NEGATIVE

## 2013-04-05 LAB — GLUCOSE, CAPILLARY: Glucose-Capillary: 129 mg/dL — ABNORMAL HIGH (ref 70–99)

## 2013-04-05 SURGERY — IRRIGATION AND DEBRIDEMENT EXTREMITY
Anesthesia: General | Site: Elbow | Laterality: Right

## 2013-04-05 MED ORDER — FENTANYL CITRATE 0.05 MG/ML IJ SOLN
INTRAMUSCULAR | Status: AC
  Filled 2013-04-05: qty 2

## 2013-04-05 MED ORDER — FENTANYL CITRATE 0.05 MG/ML IJ SOLN
INTRAMUSCULAR | Status: DC | PRN
  Administered 2013-04-05: 50 ug via INTRAVENOUS
  Administered 2013-04-05: 25 ug via INTRAVENOUS
  Administered 2013-04-05: 50 ug via INTRAVENOUS
  Administered 2013-04-05 (×2): 25 ug via INTRAVENOUS
  Administered 2013-04-05: 50 ug via INTRAVENOUS
  Administered 2013-04-05: 25 ug via INTRAVENOUS

## 2013-04-05 MED ORDER — VANCOMYCIN HCL 500 MG IV SOLR
INTRAVENOUS | Status: AC
  Filled 2013-04-05: qty 500

## 2013-04-05 MED ORDER — FENTANYL CITRATE 0.05 MG/ML IJ SOLN
INTRAMUSCULAR | Status: AC
  Filled 2013-04-05: qty 5

## 2013-04-05 MED ORDER — LIDOCAINE HCL (CARDIAC) 20 MG/ML IV SOLN
INTRAVENOUS | Status: DC | PRN
  Administered 2013-04-05: 30 mg via INTRAVENOUS

## 2013-04-05 MED ORDER — SIMVASTATIN 20 MG PO TABS
20.0000 mg | ORAL_TABLET | Freq: Every day | ORAL | Status: DC
  Administered 2013-04-06 – 2013-04-09 (×4): 20 mg via ORAL
  Filled 2013-04-05 (×5): qty 1

## 2013-04-05 MED ORDER — ADULT MULTIVITAMIN W/MINERALS CH
1.0000 | ORAL_TABLET | Freq: Every day | ORAL | Status: DC
  Administered 2013-04-05 – 2013-04-10 (×6): 1 via ORAL
  Filled 2013-04-05 (×6): qty 1

## 2013-04-05 MED ORDER — PHENYLEPHRINE HCL 10 MG/ML IJ SOLN
INTRAMUSCULAR | Status: DC | PRN
  Administered 2013-04-05: 80 ug via INTRAVENOUS
  Administered 2013-04-05: 40 ug via INTRAVENOUS
  Administered 2013-04-05: 80 ug via INTRAVENOUS
  Administered 2013-04-05 (×3): 40 ug via INTRAVENOUS

## 2013-04-05 MED ORDER — GENTAMICIN SULFATE 40 MG/ML IJ SOLN
INTRAMUSCULAR | Status: DC | PRN
  Administered 2013-04-05: 240 mg

## 2013-04-05 MED ORDER — DROPERIDOL 2.5 MG/ML IJ SOLN: 0.6250 mg | INTRAMUSCULAR | Status: DC | PRN

## 2013-04-05 MED ORDER — FENTANYL CITRATE 0.05 MG/ML IJ SOLN
50.0000 ug | INTRAMUSCULAR | Status: DC | PRN
  Administered 2013-04-05: 50 ug via INTRAVENOUS

## 2013-04-05 MED ORDER — AMLODIPINE BESYLATE 2.5 MG PO TABS
2.5000 mg | ORAL_TABLET | Freq: Every day | ORAL | Status: DC
  Administered 2013-04-05 – 2013-04-10 (×5): 2.5 mg via ORAL
  Filled 2013-04-05 (×6): qty 1

## 2013-04-05 MED ORDER — INSULIN ASPART 100 UNIT/ML ~~LOC~~ SOLN: 0.0000 [IU] | Freq: Every day | SUBCUTANEOUS | Status: DC

## 2013-04-05 MED ORDER — OXYCODONE HCL 5 MG PO TABS
ORAL_TABLET | ORAL | Status: AC
  Filled 2013-04-05: qty 1

## 2013-04-05 MED ORDER — METOPROLOL TARTRATE 1 MG/ML IV SOLN
INTRAVENOUS | Status: DC | PRN
  Administered 2013-04-05: 2.5 mg via INTRAVENOUS

## 2013-04-05 MED ORDER — LISINOPRIL 20 MG PO TABS
20.0000 mg | ORAL_TABLET | Freq: Every day | ORAL | Status: DC
  Administered 2013-04-05 – 2013-04-09 (×4): 20 mg via ORAL
  Filled 2013-04-05 (×6): qty 1

## 2013-04-05 MED ORDER — INSULIN ASPART 100 UNIT/ML ~~LOC~~ SOLN
0.0000 [IU] | Freq: Three times a day (TID) | SUBCUTANEOUS | Status: DC
  Administered 2013-04-06 (×2): 2 [IU] via SUBCUTANEOUS
  Administered 2013-04-07 – 2013-04-09 (×3): 3 [IU] via SUBCUTANEOUS
  Administered 2013-04-10: 2 [IU] via SUBCUTANEOUS

## 2013-04-05 MED ORDER — LACTATED RINGERS IV SOLN
INTRAVENOUS | Status: DC | PRN
  Administered 2013-04-05: 20:00:00 via INTRAVENOUS

## 2013-04-05 MED ORDER — ONDANSETRON HCL 4 MG/2ML IJ SOLN
INTRAMUSCULAR | Status: DC | PRN
  Administered 2013-04-05: 4 mg via INTRAVENOUS

## 2013-04-05 MED ORDER — CEFAZOLIN SODIUM-DEXTROSE 2-3 GM-% IV SOLR
INTRAVENOUS | Status: AC
  Filled 2013-04-05: qty 50

## 2013-04-05 MED ORDER — METOPROLOL TARTRATE 1 MG/ML IV SOLN
INTRAVENOUS | Status: AC
  Filled 2013-04-05: qty 5

## 2013-04-05 MED ORDER — LIDOCAINE HCL (CARDIAC) 20 MG/ML IV SOLN
INTRAVENOUS | Status: AC
  Filled 2013-04-05: qty 5

## 2013-04-05 MED ORDER — PIPERACILLIN-TAZOBACTAM 3.375 G IVPB
3.3750 g | Freq: Three times a day (TID) | INTRAVENOUS | Status: DC
  Filled 2013-04-05 (×3): qty 50

## 2013-04-05 MED ORDER — ONDANSETRON HCL 4 MG/2ML IJ SOLN
INTRAMUSCULAR | Status: AC
  Filled 2013-04-05: qty 2

## 2013-04-05 MED ORDER — SUCCINYLCHOLINE CHLORIDE 20 MG/ML IJ SOLN
INTRAMUSCULAR | Status: AC
  Filled 2013-04-05: qty 1

## 2013-04-05 MED ORDER — ASPIRIN 81 MG PO CHEW
81.0000 mg | CHEWABLE_TABLET | Freq: Two times a day (BID) | ORAL | Status: DC
  Administered 2013-04-05 – 2013-04-06 (×3): 81 mg via ORAL
  Filled 2013-04-05 (×5): qty 1

## 2013-04-05 MED ORDER — PHENYLEPHRINE 40 MCG/ML (10ML) SYRINGE FOR IV PUSH (FOR BLOOD PRESSURE SUPPORT)
PREFILLED_SYRINGE | INTRAVENOUS | Status: AC
  Filled 2013-04-05: qty 10

## 2013-04-05 MED ORDER — FENTANYL CITRATE 0.05 MG/ML IJ SOLN: 25.0000 ug | INTRAMUSCULAR | Status: DC | PRN

## 2013-04-05 MED ORDER — GENTAMICIN SULFATE 40 MG/ML IJ SOLN
INTRAMUSCULAR | Status: AC
  Filled 2013-04-05: qty 6

## 2013-04-05 MED ORDER — CEFAZOLIN SODIUM-DEXTROSE 2-3 GM-% IV SOLR
2.0000 g | Freq: Once | INTRAVENOUS | Status: AC
  Administered 2013-04-05: 2 g via INTRAVENOUS

## 2013-04-05 MED ORDER — VANCOMYCIN HCL 500 MG IV SOLR
INTRAVENOUS | Status: DC | PRN
  Administered 2013-04-05: 500 mg

## 2013-04-05 MED ORDER — HYDROMORPHONE HCL PF 1 MG/ML IJ SOLN
0.5000 mg | INTRAMUSCULAR | Status: DC | PRN
  Administered 2013-04-08: 0.5 mg via INTRAVENOUS
  Filled 2013-04-05: qty 1

## 2013-04-05 MED ORDER — SODIUM CHLORIDE 0.9 % IR SOLN
Status: DC | PRN
  Administered 2013-04-05: 6000 mL

## 2013-04-05 MED ORDER — PROPOFOL 10 MG/ML IV BOLUS
INTRAVENOUS | Status: DC | PRN
  Administered 2013-04-05: 100 mg via INTRAVENOUS

## 2013-04-05 SURGICAL SUPPLY — 67 items
BANDAGE CONFORM 3  STR LF (GAUZE/BANDAGES/DRESSINGS) IMPLANT
BANDAGE ELASTIC 3 VELCRO ST LF (GAUZE/BANDAGES/DRESSINGS) IMPLANT
BANDAGE ELASTIC 4 VELCRO ST LF (GAUZE/BANDAGES/DRESSINGS) ×4 IMPLANT
BLADE SURG 10 STRL SS (BLADE) ×1 IMPLANT
BNDG COHESIVE 1X5 TAN STRL LF (GAUZE/BANDAGES/DRESSINGS) IMPLANT
BNDG COHESIVE 4X5 TAN STRL (GAUZE/BANDAGES/DRESSINGS) ×5 IMPLANT
BNDG COHESIVE 6X5 TAN STRL LF (GAUZE/BANDAGES/DRESSINGS) ×2 IMPLANT
BNDG GAUZE ELAST 4 BULKY (GAUZE/BANDAGES/DRESSINGS) ×2 IMPLANT
BNDG GAUZE STRTCH 6 (GAUZE/BANDAGES/DRESSINGS) ×3 IMPLANT
CORDS BIPOLAR (ELECTRODE) ×2 IMPLANT
COVER SURGICAL LIGHT HANDLE (MISCELLANEOUS) ×3 IMPLANT
CUFF TOURNIQUET SINGLE 18IN (TOURNIQUET CUFF) ×1 IMPLANT
CUFF TOURNIQUET SINGLE 24IN (TOURNIQUET CUFF) IMPLANT
CUFF TOURNIQUET SINGLE 34IN LL (TOURNIQUET CUFF) IMPLANT
CUFF TOURNIQUET SINGLE 44IN (TOURNIQUET CUFF) IMPLANT
DRAPE ORTHO SPLIT 77X108 STRL (DRAPES) ×6
DRAPE SURG 17X23 STRL (DRAPES) IMPLANT
DRAPE SURG ORHT 6 SPLT 77X108 (DRAPES) ×2 IMPLANT
DRAPE U-SHAPE 47X51 STRL (DRAPES) ×3 IMPLANT
DRSG PAD ABDOMINAL 8X10 ST (GAUZE/BANDAGES/DRESSINGS) ×4 IMPLANT
DURAPREP 26ML APPLICATOR (WOUND CARE) ×3 IMPLANT
ELECT CAUTERY BLADE 6.4 (BLADE) IMPLANT
ELECT REM PT RETURN 9FT ADLT (ELECTROSURGICAL)
ELECTRODE REM PT RTRN 9FT ADLT (ELECTROSURGICAL) IMPLANT
GAUZE XEROFORM 1X8 LF (GAUZE/BANDAGES/DRESSINGS) ×1 IMPLANT
GAUZE XEROFORM 5X9 LF (GAUZE/BANDAGES/DRESSINGS) ×2 IMPLANT
GLOVE BIO SURGEON STRL SZ8 (GLOVE) ×3 IMPLANT
GLOVE BIOGEL PI IND STRL 8 (GLOVE) ×2 IMPLANT
GLOVE BIOGEL PI INDICATOR 8 (GLOVE) ×4
GLOVE ORTHO TXT STRL SZ7.5 (GLOVE) ×3 IMPLANT
GOWN STRL REUS W/ TWL LRG LVL3 (GOWN DISPOSABLE) ×1 IMPLANT
GOWN STRL REUS W/ TWL XL LVL3 (GOWN DISPOSABLE) ×4 IMPLANT
GOWN STRL REUS W/TWL LRG LVL3 (GOWN DISPOSABLE) ×3
GOWN STRL REUS W/TWL XL LVL3 (GOWN DISPOSABLE) ×12
HANDPIECE INTERPULSE COAX TIP (DISPOSABLE)
KIT BASIN OR (CUSTOM PROCEDURE TRAY) ×3 IMPLANT
KIT ROOM TURNOVER OR (KITS) ×3 IMPLANT
KIT STIMULAN RAPID CURE  10CC (Orthopedic Implant) ×2 IMPLANT
KIT STIMULAN RAPID CURE 10CC (Orthopedic Implant) IMPLANT
MANIFOLD NEPTUNE II (INSTRUMENTS) ×3 IMPLANT
NS IRRIG 1000ML POUR BTL (IV SOLUTION) ×3 IMPLANT
PACK ORTHO EXTREMITY (CUSTOM PROCEDURE TRAY) ×3 IMPLANT
PAD ARMBOARD 7.5X6 YLW CONV (MISCELLANEOUS) ×6 IMPLANT
PADDING CAST ABS 4INX4YD NS (CAST SUPPLIES) ×6
PADDING CAST ABS COTTON 4X4 ST (CAST SUPPLIES) ×2 IMPLANT
PADDING CAST COTTON 6X4 STRL (CAST SUPPLIES) ×1 IMPLANT
SET HNDPC FAN SPRY TIP SCT (DISPOSABLE) IMPLANT
SPLINT PLASTER CAST XFAST 5X30 (CAST SUPPLIES) IMPLANT
SPLINT PLASTER XFAST SET 5X30 (CAST SUPPLIES) ×2
SPONGE GAUZE 4X4 12PLY (GAUZE/BANDAGES/DRESSINGS) ×5 IMPLANT
SPONGE LAP 18X18 X RAY DECT (DISPOSABLE) ×7 IMPLANT
STOCKINETTE IMPERVIOUS 9X36 MD (GAUZE/BANDAGES/DRESSINGS) ×3 IMPLANT
SUT ETHILON 2 0 FS 18 (SUTURE) ×9 IMPLANT
SUT ETHILON 2 0 PSLX (SUTURE) ×6 IMPLANT
SUT ETHILON 3 0 PS 1 (SUTURE) ×4 IMPLANT
SUT FIBERWIRE 2-0 18 17.9 3/8 (SUTURE) ×3
SUTURE FIBERWR 2-0 18 17.9 3/8 (SUTURE) IMPLANT
SYR CONTROL 10ML LL (SYRINGE) ×2 IMPLANT
TOWEL OR 17X24 6PK STRL BLUE (TOWEL DISPOSABLE) ×3 IMPLANT
TOWEL OR 17X26 10 PK STRL BLUE (TOWEL DISPOSABLE) ×3 IMPLANT
TUBE ANAEROBIC SPECIMEN COL (MISCELLANEOUS) IMPLANT
TUBE CONNECTING 12'X1/4 (SUCTIONS) ×1
TUBE CONNECTING 12X1/4 (SUCTIONS) ×2 IMPLANT
TUBE FEEDING 5FR 15 INCH (TUBING) IMPLANT
UNDERPAD 30X30 INCONTINENT (UNDERPADS AND DIAPERS) ×3 IMPLANT
WATER STERILE IRR 1000ML POUR (IV SOLUTION) ×3 IMPLANT
YANKAUER SUCT BULB TIP NO VENT (SUCTIONS) ×3 IMPLANT

## 2013-04-05 NOTE — Transfer of Care (Signed)
Immediate Anesthesia Transfer of Care Note  Patient: Jillian Campbell  Procedure(s) Performed: Procedure(s): IRRIGATION AND DEBRIDEMENT RIGHT ELBOW insertion antibiotic beads (Right) HARDWARE REMOVAL olecranon plate and screws (Right)  Patient Location: PACU  Anesthesia Type:General  Level of Consciousness: awake and patient cooperative  Airway & Oxygen Therapy: Patient Spontanous Breathing and Patient connected to nasal cannula oxygen  Post-op Assessment: Report given to PACU RN and Post -op Vital signs reviewed and stable  Post vital signs: Reviewed and stable  Complications: No apparent anesthesia complications

## 2013-04-05 NOTE — Progress Notes (Signed)
Utilization review completed.  

## 2013-04-05 NOTE — Anesthesia Postprocedure Evaluation (Signed)
  Anesthesia Post-op Note  Patient: Jillian Campbell  Procedure(s) Performed: Procedure(s): IRRIGATION AND DEBRIDEMENT RIGHT ELBOW insertion antibiotic beads (Right) HARDWARE REMOVAL olecranon plate and screws (Right)  Patient Location: PACU  Anesthesia Type:General  Level of Consciousness: awake, alert , oriented and patient cooperative  Airway and Oxygen Therapy: Patient Spontanous Breathing and Patient connected to nasal cannula oxygen  Post-op Pain: 5 /10, mild  Post-op Assessment: Post-op Vital signs reviewed, Patient's Cardiovascular Status Stable, Respiratory Function Stable, Patent Airway, No signs of Nausea or vomiting and Pain level controlled  Post-op Vital Signs: Reviewed and stable  Complications: No apparent anesthesia complications

## 2013-04-05 NOTE — Anesthesia Preprocedure Evaluation (Addendum)
Anesthesia Evaluation  Patient identified by MRN, date of birth, ID band Patient awake    Reviewed: Allergy & Precautions, H&P , NPO status , Patient's Chart, lab work & pertinent test results, reviewed documented beta blocker date and time   History of Anesthesia Complications Negative for: history of anesthetic complications  Airway Mallampati: II TM Distance: >3 FB Neck ROM: Full    Dental  (+) Edentulous Upper, Edentulous Lower   Pulmonary former smoker (quit 1970),  breath sounds clear to auscultation  Pulmonary exam normal       Cardiovascular hypertension, Pt. on medications and Pt. on home beta blockers Rhythm:Regular Rate:Normal     Neuro/Psych Syncope in February    GI/Hepatic negative GI ROS,   Endo/Other  negative endocrine ROS  Renal/GU Renal InsufficiencyRenal disease (creat 1.16)     Musculoskeletal   Abdominal   Peds  Hematology  (+) Blood dyscrasia (hb 9.9), anemia ,   Anesthesia Other Findings   Reproductive/Obstetrics                          Anesthesia Physical Anesthesia Plan  ASA: III  Anesthesia Plan: General   Post-op Pain Management:    Induction: Intravenous  Airway Management Planned: LMA  Additional Equipment:   Intra-op Plan:   Post-operative Plan:   Informed Consent: I have reviewed the patients History and Physical, chart, labs and discussed the procedure including the risks, benefits and alternatives for the proposed anesthesia with the patient or authorized representative who has indicated his/her understanding and acceptance.     Plan Discussed with: Surgeon and CRNA  Anesthesia Plan Comments: (Plan routine monitors, GA- LMA OK)        Anesthesia Quick Evaluation

## 2013-04-05 NOTE — Progress Notes (Signed)
Inpatient Diabetes Program Recommendations  AACE/ADA: New Consensus Statement on Inpatient Glycemic Control (2013)  Target Ranges:  Prepandial:   less than 140 mg/dL      Peak postprandial:   less than 180 mg/dL (1-2 hours)      Critically ill patients:  140 - 180 mg/dL   Reason for Assessment: Lab glucose >180 mg/dL  Diabetes history: None Outpatient Diabetes medications: None Current orders for Inpatient glycemic control: None  Results for Jillian Campbell, Jillian Campbell (MRN [id number]) as of 04/05/2013 12:53  Ref. Range 04/04/2013 20:29  Sodium Latest Range: 137-147 mEq/L 133 (L)  Potassium Latest Range: 3.7-5.3 mEq/L 4.3  Chloride Latest Range: 96-112 mEq/L 97  CO2 Latest Range: 19-32 mEq/L 22  BUN Latest Range: 6-23 mg/dL 23  Creatinine Latest Range: 0.50-1.10 mg/dL 6.961.16 (H)  Calcium Latest Range: 8.4-10.5 mg/dL 8.5  GFR calc non Af Amer Latest Range: >90 mL/min 43 (L)  GFR calc Af Amer Latest Range: >90 mL/min 50 (L)  Glucose Latest Range: 70-99 mg/dL 295205 (H)    Inpatient Diabetes Program Recommendations Correction (SSI): Add Novolog sensitive tidwc HgbA1C: Check HgbA1C to assess glycemic control prior to hospitalization  Thank you. Ailene Ardshonda Manaal Mandala, RD, LDN, CDE Inpatient Diabetes Coordinator [phone]

## 2013-04-05 NOTE — Progress Notes (Signed)
Patient ID: Neta EhlersKimiko Coury, female   DOB: 1930-06-24, 80 y.o.   MRN: [id number] Mrs. Jillian Campbell understands fully that we are proceeding to the OR for an irrigation and debridement of her right elbow due to a post-operative infection.  The cultures from the office aspiration have grown out staph aureus.  No sensitivities yet.

## 2013-04-05 NOTE — Anesthesia Procedure Notes (Signed)
Procedure Name: LMA Insertion Date/Time: 04/05/2013 7:50 PM Performed by: Julianne RiceBILOTTA, Conrad Zajkowski Z Pre-anesthesia Checklist: Patient identified, Timeout performed, Emergency Drugs available, Suction available and Patient being monitored Patient Re-evaluated:Patient Re-evaluated prior to inductionOxygen Delivery Method: Circle system utilized Preoxygenation: Pre-oxygenation with 100% oxygen Intubation Type: IV induction Ventilation: Mask ventilation without difficulty LMA: LMA inserted LMA Size: 4.0 Tube type: Oral Number of attempts: 1 Placement Confirmation: breath sounds checked- equal and bilateral and positive ETCO2 Tube secured with: Tape Dental Injury: Teeth and Oropharynx as per pre-operative assessment

## 2013-04-05 NOTE — Brief Op Note (Signed)
04/04/2013 - 04/05/2013  9:23 PM  PATIENT:  Jillian Campbell  80 y.o. female  PRE-OPERATIVE DIAGNOSIS:  Right elbow infection s/p Open Reduction Internal Fixation  POST-OPERATIVE DIAGNOSIS:  Right elbow infection s/p Open Reduction Internal Fixation  PROCEDURE:  Procedure(s): IRRIGATION AND DEBRIDEMENT RIGHT ELBOW insertion antibiotic beads (Right) HARDWARE REMOVAL olecranon plate and screws (Right)  SURGEON:  Surgeon(s) and Role:    * Kathryne Hitchhristopher Y Blackman, MD - Primary  PHYSICIAN ASSISTANT: Rexene EdisonGil Clark, PA-C  ANESTHESIA:   general  EBL:  Total I/O In: 800 [I.V.:800] Out: 100 [Blood:100]  BLOOD ADMINISTERED:none  DRAINS: none   LOCAL MEDICATIONS USED:  NONE  SPECIMEN:  No Specimen  DISPOSITION OF SPECIMEN:  N/A  COUNTS:  YES  TOURNIQUET:  * No tourniquets in log *  DICTATION: .Other Dictation: Dictation Number [id number]  PLAN OF CARE: Admit to inpatient   PATIENT DISPOSITION:  PACU - hemodynamically stable.   Delay start of Pharmacological VTE agent (>24hrs) due to surgical blood loss or risk of bleeding: no

## 2013-04-05 NOTE — Progress Notes (Signed)
Subjective:   Procedure(s) (LRB): IRRIGATION AND DEBRIDEMENT RIGHT ELBOW (Right) Patient reports pain as moderate.    Objective: Vital signs in last 24 hours: Temp:  [98.4 F (36.9 C)-99.5 F (37.5 C)] 99.5 F (37.5 C) (03/26 0544) Pulse Rate:  [72-81] 72 (03/26 0544) Resp:  [16-20] 16 (03/26 0544) BP: (92-121)/(33-44) 111/40 mmHg (03/26 0544) SpO2:  [93 %-98 %] 95 % (03/26 0544)  Intake/Output from previous day: 03/25 0701 - 03/26 0700 In: 116 [I.V.:116] Out: 1 [Urine:1] Intake/Output this shift: Total I/O In: -  Out: 1 [Urine:1]   Recent Labs  04/04/13 2029  HGB 9.9*    Recent Labs  04/04/13 2029  WBC 8.4  RBC 3.32*  HCT 30.1*  PLT 176    Recent Labs  04/04/13 2029  NA 133*  K 4.3  CL 97  CO2 22  BUN 23  CREATININE 1.16*  GLUCOSE 205*  CALCIUM 8.5   No results found for this basename: LABPT, INR,  in the last 72 hours  Right ARM: Sensation intact  Radial pulse intact Elbow unchanged erythema no drainage . Assessment/Plan:   Procedure(s) (LRB): IRRIGATION AND DEBRIDEMENT RIGHT ELBOW (Right) Surgery later today NPO  Jillian Campbell 04/05/2013, 12:58 PM

## 2013-04-06 ENCOUNTER — Encounter (HOSPITAL_COMMUNITY): Payer: Self-pay | Admitting: Orthopaedic Surgery

## 2013-04-06 LAB — GLUCOSE, CAPILLARY
Glucose-Capillary: 111 mg/dL — ABNORMAL HIGH (ref 70–99)
Glucose-Capillary: 137 mg/dL — ABNORMAL HIGH (ref 70–99)
Glucose-Capillary: 148 mg/dL — ABNORMAL HIGH (ref 70–99)
Glucose-Capillary: 126 mg/dL — ABNORMAL HIGH (ref 70–99)

## 2013-04-06 MED ORDER — LACTATED RINGERS IV BOLUS (SEPSIS)
500.0000 mL | Freq: Once | INTRAVENOUS | Status: AC
  Administered 2013-04-06: 500 mL via INTRAVENOUS

## 2013-04-06 NOTE — Care Management Note (Signed)
CARE MANAGEMENT NOTE 04/06/2013  Patient:  Jillian Campbell,Jillian Campbell   Account Number:  [id number]  Date Initiated:  04/06/2013  Documentation initiated by:  Vance PeperBRADY,Anandi Abramo  Subjective/Objective Assessment:   80 yr old female admitted with infected Right humerus, s/p ORIF     Action/Plan:   Patient's daughter Jillian Campbell is handling mom's care and HH and discharge. Patient will need PICC for IV antibiotics for 6 weeks. Case Manager will continue to monitor.   Anticipated DC Date:  04/09/2013   Anticipated DC Plan:  HOME W HOME HEALTH SERVICES      DC Planning Services  CM consult      [facility]  Resumption Of Svcs/PTA Provider   Choice offered to / List presented to:  C-4 Adult Children        HH arranged  IV Antibiotics  HH-2 PT  HH-1 RN      Status of service:  In process, will continue to follow  Comments:  04/06/13 12:59p Vance PeperSusan Envi Eagleson, RN Case Manager [phone] Case Manager spoke with patient's daughter at length on 3/26 concerning her mom's needs at discharge. Jillian Campbell is planning to [place] her parents [place], while her mom recovers.Jillian Campbell [address] [place], [place] [id number] phone [phone].  Home Health needs  are being discussed. Patient is active with [facility] and they are arranging for IV antibiotics in [place].Jillian Campbell states that her doctor -Dr.Cleland Child, and he is willing to follow Jillian Campbell. Dr.Child fax#[phone].

## 2013-04-06 NOTE — Op Note (Signed)
NAMELASONYA, [name]:  [id number]  MEDICAL RECORD NO.:  [id number]  LOCATION:  5N12C                        FACILITY:  [facility]  PHYSICIAN:  Vanita Panda. Magnus Ivan, M.D.DATE OF BIRTH:  1930-02-08  DATE OF PROCEDURE:  04/05/2013 DATE OF DISCHARGE:                              OPERATIVE REPORT   PREOPERATIVE DIAGNOSIS:  Right elbow wound dehiscence with active infection 3 weeks status post open reduction and internal fixation of a transcondylar distal humerus fracture with olecranon osteotomy.  POSTOPERATIVE DIAGNOSIS:  Right elbow wound dehiscence with active infection 3 weeks status post open reduction and internal fixation of a transcondylar distal humerus fracture with olecranon osteotomy.  FINDINGS: 1. Wound dehiscence directly over olecranon plate from hardware     prominence. 2. Gross purulence around the olecranon plate and distal humerus.  PROCEDURE: 1. Irrigation and debridement of right elbow skin, superficial, and     deep tissues. 2. Removal of olecranon plate, but placement of small olecranon screw     to stabilize olecranon osteotomy. 3. Placement of antibiotic-laden stimulant beads mixed with vancomycin     and gentamicin.  SURGEON:  Vanita Panda. Magnus Ivan, M.D.  ASSISTANT:  Richardean Canal, P.A.C.  ANESTHESIA:  General.  BLOOD LOSS:  Less than 100 mL.  COMPLICATIONS:  None.  INDICATIONS:  Jillian Campbell is an 80 year old female, who 3 weeks ago we saw in the emergency room.  She had a transcondylar fracture of her distal humerus, it was over a week old.  Her husband brought her to the hospital due to significant swelling, bruising, and pain.  The fracture was significantly displaced with tenting of the skin.  We took her to the operating room the next day, where she underwent open reduction and internal fixation with medial plate and posterolateral plate on the distal humerus and we had to perform olecranon osteotomy due to the distal  nature of the fracture.  We repaired the osteotomy with plate. In hindsight, she probably need tension band wiring because at her age and elderly skin, she ended up resting her elbow on furniture for the last few weeks and developed a small wound over the prominent plate and then developed an infection.  I saw her in the office yesterday and she had developed redness and wound dehiscence.  I placed a needle in deep tissue and pulled out the material that appeared to be grossly purulent. We did send off labs and so far grown out Staph aureus but no sensitivities are back.  I recommend she undergo an irrigation and debridement with likely removal of olecranon plate and placement of stimulant antibiotic beads.  The risks and benefits of this were explained to her and her husband.  They do understand the need to proceed with surgery.  PROCEDURE DESCRIPTION:  After informed consent was obtained, appropriate right elbow was marked.  She was brought to the operating, placed supine on the operating table.  General anesthesia was then obtained.  We prepped the right arm with DuraPrep and sterile drapes including sterile stockinette.  Time-out was called and she was identified as correct patient, correct right elbow.  We could see that there was a wound dehiscence.  I made incision directly over old incision and found purulence around the olecranon plate.  I dissected the deep tissues and found some purulence proximal as well.  We then irrigated 6 L of normal saline solution to the elbow using pulsatile lavage.  We also removed the olecranon plate, but we placed the "home-run screw" back into the olecranon and buried in the deep tissue to at least stabilize the olecranon osteotomy.  We then had already makes stimulant antibiotic beads and mixed these with 500 mg of vancomycin and 3 vials of 80 mg of gentamicin.  We placed the beads in the superficial and deep tissues and then reapproximated the  skin with interrupted 2-0 nylon suture.  We then placed well-padded sterile dressing and placed her elbow also in a plaster splint.  She was then awakened, extubated, and taken to the recovery room in stable condition.  All final counts were correct. There were no complications noted.  Of note, Richardean CanalGilbert Clark, PA-C was present in the entire case.  His presence was crucial for assisting and holding the arm, retracting and closure of the wound.     Vanita Pandahristopher Y. Magnus IvanBlackman, M.D.     CYB/MEDQ  D:  04/05/2013  T:  04/06/2013  Job:  [id number]

## 2013-04-06 NOTE — Progress Notes (Signed)
Subjective: 1 Day Post-Op Procedure(s) (LRB): IRRIGATION AND DEBRIDEMENT RIGHT ELBOW insertion antibiotic beads (Right) HARDWARE REMOVAL olecranon plate and screws (Right) Patient reports pain as moderate.    Objective: Vital signs in last 24 hours: Temp:  [97.9 F (36.6 C)-99 F (37.2 C)] 98.8 F (37.1 C) (03/27 0622) Pulse Rate:  [63-106] 71 (03/27 0622) Resp:  [11-20] 16 (03/27 0622) BP: (93-145)/(31-50) 109/35 mmHg (03/27 0622) SpO2:  [85 %-100 %] 96 % (03/27 0622)  Intake/Output from previous day: 03/26 0701 - 03/27 0700 In: 1020 [P.O.:120; I.V.:900] Out: 101 [Urine:1; Blood:100] Intake/Output this shift:     Recent Labs  04/04/13 2029  HGB 9.9*    Recent Labs  04/04/13 2029  WBC 8.4  RBC 3.32*  HCT 30.1*  PLT 176    Recent Labs  04/04/13 2029  NA 133*  K 4.3  CL 97  CO2 22  BUN 23  CREATININE 1.16*  GLUCOSE 205*  CALCIUM 8.5   No results found for this basename: LABPT, INR,  in the last 72 hours  Neurovascular intact Incision: dressing C/D/I  Assessment/Plan: 1 Day Post-Op Procedure(s) (LRB): IRRIGATION AND DEBRIDEMENT RIGHT ELBOW insertion antibiotic beads (Right) HARDWARE REMOVAL olecranon plate and screws (Right) Continue IV antibiotics until sensitivities known - has staph aureus PICC line placement for long-term antibiotics  Jillian Campbell Y 04/06/2013, 7:02 AM

## 2013-04-06 NOTE — Progress Notes (Signed)
0320am - Pt BP = 93/31; on call, Dr. Lajoyce Cornersuda contacted.  Telephone orders for 500cc bolus of lactated ringers.    0630am -  Pt BP = 99/46; on call Dr. Lajoyce Cornersuda contacted.  No new orders at this time.  Patient without complaints.  Will continue to monitor patient.

## 2013-04-07 LAB — CREATININE, SERUM
Creatinine, Ser: 1.26 mg/dL — ABNORMAL HIGH (ref 0.50–1.10)
GFR, EST AFRICAN AMERICAN: 45 mL/min — AB (ref >90)
GFR, EST NON AFRICAN AMERICAN: 39 mL/min — AB (ref >90)

## 2013-04-07 LAB — VANCOMYCIN, TROUGH: Vancomycin Tr: 20 ug/mL (ref 10.0–20.0)

## 2013-04-07 LAB — GLUCOSE, CAPILLARY
GLUCOSE-CAPILLARY: 160 mg/dL — AB (ref 70–99)
GLUCOSE-CAPILLARY: 126 mg/dL — AB (ref 70–99)
Glucose-Capillary: 114 mg/dL — ABNORMAL HIGH (ref 70–99)
Glucose-Capillary: 108 mg/dL — ABNORMAL HIGH (ref 70–99)

## 2013-04-07 MED ORDER — SODIUM CHLORIDE 0.9 % IJ SOLN
10.0000 mL | Freq: Two times a day (BID) | INTRAMUSCULAR | Status: DC
Start: 2013-04-07 — End: 2013-04-10
  Administered 2013-04-07 – 2013-04-08 (×3): 10 mL

## 2013-04-07 MED ORDER — SODIUM CHLORIDE 0.9 % IJ SOLN
10.0000 mL | INTRAMUSCULAR | Status: DC | PRN
  Administered 2013-04-08 – 2013-04-10 (×2): 10 mL

## 2013-04-07 MED ORDER — ASPIRIN 325 MG PO TABS
325.0000 mg | ORAL_TABLET | Freq: Two times a day (BID) | ORAL | Status: DC
  Administered 2013-04-07 – 2013-04-10 (×7): 325 mg via ORAL
  Filled 2013-04-07 (×10): qty 1

## 2013-04-07 MED ORDER — VANCOMYCIN HCL IN DEXTROSE 1-5 GM/200ML-% IV SOLN
1000.0000 mg | INTRAVENOUS | Status: DC
  Filled 2013-04-07: qty 200

## 2013-04-07 NOTE — Progress Notes (Signed)
Peripherally Inserted Central Catheter/Midline Placement  The IV Nurse has discussed with the patient and/or persons authorized to consent for the patient, the purpose of this procedure and the potential benefits and risks involved with this procedure.  The benefits include less needle sticks, lab draws from the catheter and patient may be discharged home with the catheter.  Risks include, but not limited to, infection, bleeding, blood clot (thrombus formation), and puncture of an artery; nerve damage and irregular heat beat.  Alternatives to this procedure were also discussed.  PICC/Midline Placement Documentation  PICC / Midline Single Lumen 04/07/13 PICC Left Basilic 39 cm 0 cm (Active)  Indication for Insertion or Continuance of Line Home intravenous therapies (PICC only) 04/07/2013  9:11 AM  Exposed Catheter (cm) 0 cm 04/07/2013  9:11 AM  Line Status Flushed;Saline locked;Blood return noted 04/07/2013  9:11 AM  Dressing Change Due 04/14/13 04/07/2013  9:11 AM       Ethelda Chickurrie, Nazarene Bunning Robert 04/07/2013, 9:27 AM

## 2013-04-07 NOTE — Progress Notes (Signed)
Patient ID: Jillian EhlersKimiko Campbell, female   DOB: 1930-12-13, 80 y.o.   MRN: [id number] Doing well overall.  Awaiting final culture sensitivities. Done thru my office and [facility], so will likely not find out until Monday.  So far the culture shows Staph Aureus.  PICC line going in today.  Once final sensitivities back, will be able to make antibiotic recommendations and duration of antibiotics for home health.

## 2013-04-07 NOTE — Progress Notes (Signed)
ANTIBIOTIC CONSULT NOTE - FOLLOW UP  Pharmacy Consult for Vancomycin Indication: Staph aureus R-elbow post-op infection (s/p hardware removal and placement of antibiotic beads)  No Known Allergies  Patient Measurements: Height: 4' 11.84" (152 cm) Weight: 145 lb 8.1 oz (66 kg) IBW/kg (Calculated) : 45.14  Vital Signs: Temp: 97.5 F (36.4 C) (03/28 0545) Temp src: Oral (03/28 0545) BP: 125/50 mmHg (03/28 0545) Pulse Rate: 90 (03/28 0545) Intake/Output from previous day: 03/27 0701 - 03/28 0700 In: 880 [P.O.:480; IV Piggyback:400] Out: -  Intake/Output from this shift: Total I/O In: 480 [P.O.:480] Out: -   Labs:  Recent Labs  04/04/13 2029 04/07/13 0730  WBC 8.4  --   HGB 9.9*  --   PLT 176  --   CREATININE 1.16* 1.26*   Estimated Creatinine Clearance: 30.1 ml/min (by C-G formula based on Cr of 1.26).  Recent Labs  04/07/13 0730  VANCOTROUGH 20.0     Microbiology: Recent Results (from the past 720 hour(s))  SURGICAL PCR SCREEN     Status: None   Collection Time    04/05/13  3:04 PM      Result Value Ref Range Status   MRSA, PCR NEGATIVE  NEGATIVE Final   Staphylococcus aureus NEGATIVE  NEGATIVE Final   Comment:            The Xpert SA Assay (FDA     approved for NASAL specimens     in patients over 78 years of age),     is one component of     a comprehensive surveillance     program.  Test performance has     been validated by [facility] for patients greater     than or equal to 489 year old.     It is not intended     to diagnose infection nor to     guide or monitor treatment.    Anti-infectives   Start     Dose/Rate Route Frequency Ordered Stop   04/05/13 2048  vancomycin (VANCOCIN) powder  Status:  Discontinued       As needed 04/05/13 2049 04/05/13 2113   04/05/13 2046  gentamicin (GARAMYCIN) injection  Status:  Discontinued       As needed 04/05/13 2048 04/05/13 2113   04/05/13 2000  ceFAZolin (ANCEF) IVPB 2 g/50 mL premix     2  g 100 mL/hr over 30 Minutes Intravenous  Once 04/05/13 1919 04/05/13 1942   04/05/13 1926  ceFAZolin (ANCEF) 2-3 GM-% IVPB SOLR    Comments:  Bilotta, Mary   : cabinet override      04/05/13 1926 04/06/13 0729   04/05/13 1415  piperacillin-tazobactam (ZOSYN) IVPB 3.375 g  Status:  Discontinued     3.375 g 12.5 mL/hr over 240 Minutes Intravenous 3 times per day 04/05/13 1401 04/05/13 1919   04/05/13 0800  vancomycin (VANCOCIN) 500 mg in sodium chloride 0.9 % 100 mL IVPB     500 mg 100 mL/hr over 60 Minutes Intravenous Every 12 hours 04/04/13 2131     04/05/13 0600  piperacillin-tazobactam (ZOSYN) IVPB 3.375 g  Status:  Discontinued     3.375 g 12.5 mL/hr over 240 Minutes Intravenous 3 times per day 04/04/13 2131 04/05/13 1919   04/04/13 2000  piperacillin-tazobactam (ZOSYN) IVPB 3.375 g     3.375 g 100 mL/hr over 30 Minutes Intravenous  Once 04/04/13 1851 04/04/13 1939   04/04/13 2000  vancomycin (VANCOCIN) IVPB 1000 mg/200  mL premix     1,000 mg 200 mL/hr over 60 Minutes Intravenous  Once 04/04/13 1854 04/04/13 2009   04/04/13 1730  vancomycin (VANCOCIN) IVPB 1000 mg/200 mL premix  Status:  Discontinued     1,000 mg 200 mL/hr over 60 Minutes Intravenous NOW 04/04/13 1717 04/04/13 1854   04/04/13 1730  piperacillin-tazobactam (ZOSYN) IVPB 3.375 g  Status:  Discontinued     3.375 g 100 mL/hr over 30 Minutes Intravenous NOW 04/04/13 1717 04/04/13 1851      Assessment: 80 y.o. F who continues on Vancomycin for S. Aureus (from outpatient cultures) R-elbow post-op infection (3 weeks s/p ORIF) - now s/p hardware removal and placement of antibiotic beads on 04/06/13. Final cultures reports from the outpatient culture are pending and likely won't be back until Monday per MD note. A Vancomycin trough this morning was therapeutic however on the higher end of the range (Vanc trough 20, goal of 15-20). Will extend interval to avoid accumulation and to make administration easier if the patient does  need to go home on IV antibiotics.  Vanc 3/25 >> * 3/28 VT 20 >> adjusted to 1g/24h Zosyn 3/25 >> 3/26  PTA Cx (ortho office) >> S aureus (final results likely to come back Mon)  Goal of Therapy:  Vancomycin trough level 15-20 mcg/ml  Plan:  1. Adjust Vancomycin to 1g IV every 24 hours 2. Will continue to follow renal function, culture results, LOT, and antibiotic de-escalation plans   Georgina Pillion, PharmD, BCPS Clinical Pharmacist Pager: [phone] 04/07/2013 2:50 PM

## 2013-04-08 LAB — CBC
HCT: 27.1 % — ABNORMAL LOW (ref 36.0–46.0)
Hemoglobin: 8.9 g/dL — ABNORMAL LOW (ref 12.0–15.0)
MCH: 30 pg (ref 26.0–34.0)
MCHC: 32.8 g/dL (ref 30.0–36.0)
MCV: 91.2 fL (ref 78.0–100.0)
PLATELETS: 234 10*3/uL (ref 150–400)
RBC: 2.97 MIL/uL — ABNORMAL LOW (ref 3.87–5.11)
RDW: 12.9 % (ref 11.5–15.5)
WBC: 6.3 10*3/uL (ref 4.0–10.5)

## 2013-04-08 LAB — GLUCOSE, CAPILLARY
GLUCOSE-CAPILLARY: 104 mg/dL — AB (ref 70–99)
Glucose-Capillary: 134 mg/dL — ABNORMAL HIGH (ref 70–99)
Glucose-Capillary: 110 mg/dL — ABNORMAL HIGH (ref 70–99)
Glucose-Capillary: 109 mg/dL — ABNORMAL HIGH (ref 70–99)

## 2013-04-08 LAB — BASIC METABOLIC PANEL
BUN: 22 mg/dL (ref 6–23)
CALCIUM: 10 mg/dL (ref 8.4–10.5)
CO2: 28 mEq/L (ref 19–32)
Chloride: 102 mEq/L (ref 96–112)
Creatinine, Ser: 1.32 mg/dL — ABNORMAL HIGH (ref 0.50–1.10)
GFR, EST AFRICAN AMERICAN: 43 mL/min — AB (ref >90)
GFR, EST NON AFRICAN AMERICAN: 37 mL/min — AB (ref >90)
Glucose, Bld: 107 mg/dL — ABNORMAL HIGH (ref 70–99)
Potassium: 4.7 mEq/L (ref 3.7–5.3)
SODIUM: 140 meq/L (ref 137–147)

## 2013-04-08 MED ORDER — ATENOLOL 25 MG PO TABS
25.0000 mg | ORAL_TABLET | Freq: Every day | ORAL | Status: DC
  Administered 2013-04-08 – 2013-04-10 (×3): 25 mg via ORAL
  Filled 2013-04-08 (×3): qty 1

## 2013-04-08 MED ORDER — VANCOMYCIN HCL IN DEXTROSE 1-5 GM/200ML-% IV SOLN
1000.0000 mg | INTRAVENOUS | Status: DC
  Administered 2013-04-08 – 2013-04-10 (×3): 1000 mg via INTRAVENOUS
  Filled 2013-04-08 (×4): qty 200

## 2013-04-08 NOTE — Progress Notes (Signed)
Subjective: 3 Days Post-Op Procedure(s) (LRB): IRRIGATION AND DEBRIDEMENT RIGHT ELBOW insertion antibiotic beads (Right) HARDWARE REMOVAL olecranon plate and screws (Right) Patient reports pain as 4 on 0-10 scale.    Objective: Vital signs in last 24 hours: Temp:  [97.7 F (36.5 C)-98.4 F (36.9 C)] 98.4 F (36.9 C) (03/28 1951) Pulse Rate:  [89-92] 89 (03/28 1951) Resp:  [16-18] 16 (03/28 1951) BP: (103-133)/(43-49) 133/49 mmHg (03/28 1951) SpO2:  [96 %-97 %] 96 % (03/28 1951)  Intake/Output from previous day: 03/28 0701 - 03/29 0700 In: 600 [P.O.:480; I.V.:120] Out: 1 [Urine:1] Intake/Output this shift: Total I/O In: 320 [P.O.:320] Out: -    Recent Labs  04/08/13 0500  HGB 8.9*    Recent Labs  04/08/13 0500  WBC 6.3  RBC 2.97*  HCT 27.1*  PLT 234    Recent Labs  04/07/13 0730 04/08/13 0500  NA  --  140  K  --  4.7  CL  --  102  CO2  --  28  BUN  --  22  CREATININE 1.26* 1.32*  GLUCOSE  --  107*  CALCIUM  --  10.0   No results found for this basename: LABPT, INR,  in the last 72 hours  dressing dry. cultures pending. ? available [facility] Monday  Assessment/Plan: 3 Days Post-Op Procedure(s) (LRB): IRRIGATION AND DEBRIDEMENT RIGHT ELBOW insertion antibiotic beads (Right) HARDWARE REMOVAL olecranon plate and screws (Right) Continue IV ABX for  infection.   Look for culture results Monday   Jillian Campbell C 04/08/2013, 11:09 AM

## 2013-04-09 LAB — GLUCOSE, CAPILLARY
GLUCOSE-CAPILLARY: 101 mg/dL — AB (ref 70–99)
GLUCOSE-CAPILLARY: 157 mg/dL — AB (ref 70–99)
GLUCOSE-CAPILLARY: 123 mg/dL — AB (ref 70–99)
Glucose-Capillary: 119 mg/dL — ABNORMAL HIGH (ref 70–99)

## 2013-04-09 MED ORDER — HYDROCODONE-ACETAMINOPHEN 5-325 MG PO TABS: 1.0000 | ORAL_TABLET | ORAL | Status: AC | PRN

## 2013-04-09 MED ORDER — SODIUM CHLORIDE 0.9 % IV SOLN
INTRAVENOUS | Status: DC
  Administered 2013-04-09: 09:00:00 via INTRAVENOUS

## 2013-04-09 MED ORDER — CIPROFLOXACIN HCL 500 MG PO TABS: 500.0000 mg | ORAL_TABLET | Freq: Two times a day (BID) | ORAL | Status: AC

## 2013-04-09 MED ORDER — VANCOMYCIN HCL IN DEXTROSE 1-5 GM/200ML-% IV SOLN: 1000.0000 mg | INTRAVENOUS | Status: AC

## 2013-04-09 MED ORDER — ASPIRIN 325 MG PO TABS: 325.0000 mg | ORAL_TABLET | Freq: Two times a day (BID) | ORAL | Status: AC

## 2013-04-09 MED ORDER — DOCUSATE SODIUM 100 MG PO CAPS: 100.0000 mg | ORAL_CAPSULE | Freq: Two times a day (BID) | ORAL | Status: AC

## 2013-04-09 NOTE — Progress Notes (Signed)
[facility]  Patient Status:   [facility]  [facility] is providing the following services: [facility] will support Home IV Antibiotics.  [facility] will work with [facility] who [facility].  [facility] will [facility].   If patient discharges after hours, please call [phone].   Sedalia Mutaamela S Chandler 04/09/2013, 12:00 PM

## 2013-04-09 NOTE — Progress Notes (Signed)
Orthopedic Tech Progress Note Patient Details:  Neta EhlersKimiko Grogan 1930/12/14 [id number]  Ortho Devices Type of Ortho Device: Arm sling Ortho Device/Splint Interventions: Application   Shawnie PonsCammer, Jeryn Bertoni Carol 04/09/2013, 5:05 PM

## 2013-04-09 NOTE — Progress Notes (Signed)
Subjective: 4 Days Post-Op Procedure(s) (LRB): IRRIGATION AND DEBRIDEMENT RIGHT ELBOW insertion antibiotic beads (Right) HARDWARE REMOVAL olecranon plate and screws (Right) Patient reports pain as mild.  Wants sling for right arm.  Objective: Vital signs in last 24 hours: Temp:  [98.3 F (36.8 C)-98.4 F (36.9 C)] 98.4 F (36.9 C) (03/30 0453) Pulse Rate:  [70-72] 70 (03/30 0453) Resp:  [18-19] 18 (03/30 0453) BP: (121-162)/(46-76) 121/76 mmHg (03/30 0453) SpO2:  [95 %-100 %] 95 % (03/30 0453)  Intake/Output from previous day: 03/29 0701 - 03/30 0700 In: 760 [P.O.:560; IV Piggyback:200] Out: -  Intake/Output this shift: Total I/O In: 483.7 [P.O.:240; I.V.:43.7; IV Piggyback:200] Out: -    Recent Labs  04/08/13 0500  HGB 8.9*    Recent Labs  04/08/13 0500  WBC 6.3  RBC 2.97*  HCT 27.1*  PLT 234    Recent Labs  04/07/13 0730 04/08/13 0500  NA  --  140  K  --  4.7  CL  --  102  CO2  --  28  BUN  --  22  CREATININE 1.26* 1.32*  GLUCOSE  --  107*  CALCIUM  --  10.0   No results found for this basename: LABPT, INR,  in the last 72 hours  Sensation intact distally Incision: dressing C/D/I Fingers warm to touch, no erythema decreased edema of fingers  Assessment/Plan: 4 Days Post-Op Procedure(s) (LRB): IRRIGATION AND DEBRIDEMENT RIGHT ELBOW insertion antibiotic beads (Right) HARDWARE REMOVAL olecranon plate and screws (Right) Plan for discharge tomorrow Sling right arm for comfort  CLARK, GILBERT 04/09/2013, 12:18 PM

## 2013-04-09 NOTE — Discharge Summary (Signed)
Patient ID: Jillian Campbell MRN: [id number] DOB/AGE: [birth year] 80 y.o.  Admit date: 04/04/2013 Discharge date: 04/09/2013  Admission Diagnoses:  Active Problems:   Infection of elbow   Discharge Diagnoses:  S/p Irrigation and debridement right elbow with hardware removal Hyperglycemia   Past Medical History  Diagnosis Date  . Hypertension   . High cholesterol   . Peptic ulcer 1970's  . Arthritis     "hands" (04/04/2013)    Surgeries: Procedure(s): IRRIGATION AND DEBRIDEMENT RIGHT ELBOW insertion antibiotic beads HARDWARE REMOVAL olecranon plate and screws on 04/04/2013 - 04/05/2013   Consultants:  Social services  Discharged Condition: Improved  Hospital Course: Jillian EhlersKimiko Davidoff is an 80 y.o. female who was admitted 04/04/2013 for operative treatment of<principal problem not specified>. Patient has severe unremitting pain that affects sleep, daily activities, and work/hobbies. After pre-op clearance the patient was taken to the operating room on 04/04/2013 - 04/05/2013 and underwent  Procedure(s): IRRIGATION AND DEBRIDEMENT RIGHT ELBOW insertion antibiotic beads HARDWARE REMOVAL olecranon plate and screws.   Glucose elevated placed on Insulin sliding scale.  Patient was given perioperative antibiotics: Anti-infectives   Start     Dose/Rate Route Frequency Ordered Stop   04/09/13 0000  vancomycin (VANCOCIN) 1 GM/200ML SOLN     1,000 mg 200 mL/hr over 60 Minutes Intravenous Every 24 hours 04/09/13 1233     04/09/13 0000  ciprofloxacin (CIPRO) 500 MG tablet     500 mg Oral 2 times daily 04/09/13 1233     04/08/13 2200  vancomycin (VANCOCIN) IVPB 1000 mg/200 mL premix  Status:  Discontinued     1,000 mg 200 mL/hr over 60 Minutes Intravenous Every 24 hours 04/07/13 1450 04/08/13 1009   04/08/13 1030  vancomycin (VANCOCIN) IVPB 1000 mg/200 mL premix     1,000 mg 200 mL/hr over 60 Minutes Intravenous Every 24 hours 04/08/13 1009     04/05/13 2048  vancomycin (VANCOCIN) powder  Status:   Discontinued       As needed 04/05/13 2049 04/05/13 2113   04/05/13 2046  gentamicin (GARAMYCIN) injection  Status:  Discontinued       As needed 04/05/13 2048 04/05/13 2113   04/05/13 2000  ceFAZolin (ANCEF) IVPB 2 g/50 mL premix     2 g 100 mL/hr over 30 Minutes Intravenous  Once 04/05/13 1919 04/05/13 1942   04/05/13 1926  ceFAZolin (ANCEF) 2-3 GM-% IVPB SOLR    Comments:  Bilotta, Mary   : cabinet override      04/05/13 1926 04/06/13 0729   04/05/13 1415  piperacillin-tazobactam (ZOSYN) IVPB 3.375 g  Status:  Discontinued     3.375 g 12.5 mL/hr over 240 Minutes Intravenous 3 times per day 04/05/13 1401 04/05/13 1919   04/05/13 0800  vancomycin (VANCOCIN) 500 mg in sodium chloride 0.9 % 100 mL IVPB  Status:  Discontinued     500 mg 100 mL/hr over 60 Minutes Intravenous Every 12 hours 04/04/13 2131 04/07/13 1450   04/05/13 0600  piperacillin-tazobactam (ZOSYN) IVPB 3.375 g  Status:  Discontinued     3.375 g 12.5 mL/hr over 240 Minutes Intravenous 3 times per day 04/04/13 2131 04/05/13 1919   04/04/13 2000  piperacillin-tazobactam (ZOSYN) IVPB 3.375 g     3.375 g 100 mL/hr over 30 Minutes Intravenous  Once 04/04/13 1851 04/04/13 1939   04/04/13 2000  vancomycin (VANCOCIN) IVPB 1000 mg/200 mL premix     1,000 mg 200 mL/hr over 60 Minutes Intravenous  Once 04/04/13 1854 04/04/13 2009   04/04/13  1730  vancomycin (VANCOCIN) IVPB 1000 mg/200 mL premix  Status:  Discontinued     1,000 mg 200 mL/hr over 60 Minutes Intravenous NOW 04/04/13 1717 04/04/13 1854   04/04/13 1730  piperacillin-tazobactam (ZOSYN) IVPB 3.375 g  Status:  Discontinued     3.375 g 100 mL/hr over 30 Minutes Intravenous NOW 04/04/13 1717 04/04/13 1851       Patient was given sequential compression devices, early ambulation, and chemoprophylaxis to prevent DVT.  Patient benefited maximally from hospital stay and there were no complications.    Recent vital signs: Patient Vitals for the past 24 hrs:  BP Temp Temp  src Pulse Resp SpO2  04/09/13 0453 121/76 mmHg 98.4 F (36.9 C) Oral 70 18 95 %  04/08/13 2214 162/46 mmHg 98.3 F (36.8 C) Oral 72 19 100 %     Recent laboratory studies:  Recent Labs  04/07/13 0730 04/08/13 0500  WBC  --  6.3  HGB  --  8.9*  HCT  --  27.1*  PLT  --  234  NA  --  140  K  --  4.7  CL  --  102  CO2  --  28  BUN  --  22  CREATININE 1.26* 1.32*  GLUCOSE  --  107*  CALCIUM  --  10.0     Discharge Medications:     Medication List    STOP taking these medications       aspirin 81 MG chewable tablet  Replaced by:  aspirin 325 MG tablet      TAKE these medications       amLODipine 2.5 MG tablet  Commonly known as:  NORVASC  Take 2.5 mg by mouth daily.     aspirin 325 MG tablet  Take 1 tablet (325 mg total) by mouth 2 (two) times daily after a meal.     atenolol 25 MG tablet  Commonly known as:  TENORMIN  Take 25 mg by mouth daily.     ciprofloxacin 500 MG tablet  Commonly known as:  CIPRO  Take 1 tablet (500 mg total) by mouth 2 (two) times daily.     docusate sodium 100 MG capsule  Commonly known as:  COLACE  Take 1 capsule (100 mg total) by mouth 2 (two) times daily.     HYDROcodone-acetaminophen 5-325 MG per tablet  Commonly known as:  NORCO/VICODIN  Take 1-2 tablets by mouth every 4 (four) hours as needed for moderate pain.     lisinopril 20 MG tablet  Commonly known as:  PRINIVIL,ZESTRIL  Take 20 mg by mouth daily.     multivitamin with minerals Tabs tablet  Take 1 tablet by mouth daily.     simvastatin 20 MG tablet  Commonly known as:  ZOCOR  Take 20 mg by mouth daily.     triamcinolone cream 0.1 %  Commonly known as:  KENALOG  Apply 1 application topically 2 (two) times daily.     vancomycin 1 GM/200ML Soln  Commonly known as:  VANCOCIN  Inject 200 mLs (1,000 mg total) into the vein daily.     VITAMIN D PO  Take 1 tablet by mouth 2 (two) times daily.        Diagnostic Studies: Dg Elbow Complete Right  04/05/2013    CLINICAL DATA:  ORIF distal right humerus. Fall since ORIF. Redness and swelling of the elbow.  EXAM: RIGHT ELBOW - COMPLETE 3+ VIEW  COMPARISON:  DG ELBOW COMPLETE*R* dated 03/03/2013  FINDINGS: No hardware failure or complication in this patient status post ORIF. Plate and screw fixation of the distal humerus and proximal ulna appears unchanged when compared to fluoroscopic spot films from the fixation. There is soft tissue swelling around the elbow, more prominent in the medial aspect of the lower arm.  IMPRESSION: No interval change or complicating features ORIF of the right elbow. Soft tissue swelling distal arm.   Electronically Signed   By: Andreas Newport M.D.   On: 04/05/2013 07:17    Disposition: 01-Home or Self Care      Discharge Orders   Future Orders Complete By Expires   Discharge wound care:  As directed    Scheduling Instructions:     keep dressing clean dry and intact   Elevate operative extremity  As directed    Comments:     Encourage patient to wiggle fingers often   Non weight bearing  As directed    Scheduling Instructions:     Sling for comfort   Comments:     Non weight bearing left leg   Questions:     Laterality:  right   Extremity:  Upper      Follow-up Information   Follow up with [facility].   Contact informationBeverly Gust [facility] [phone] Contact person: Veneta Penton or Melissa Cornay       Follow up with Kathryne Hitch, MD In 2 weeks. (2 weeks from day of surgery)    Specialty:  Orthopedic Surgery   Contact information:   [address] [place] [address] [phone]        Signed: Richardean Canal 04/09/2013, 1:23 PM

## 2013-04-09 NOTE — Care Management Note (Signed)
CARE MANAGEMENT NOTE 04/09/2013  Patient:  Jillian Campbell,Jillian Campbell   Account Number:  [id number]  Date Initiated:  04/06/2013  Documentation initiated by:  Vance PeperBRADY,Janene Yousuf  Subjective/Objective Assessment:   80 yr old female admitted with infected Right humerus, s/p ORIF     Action/Plan:   Patient's daughter Jillian Campbell is handling mom's care and HH and discharge. Patient will need PICC for IV antibiotics for 6 weeks. Case Manager will continue to monitor.   Anticipated DC Date:  04/09/2013   Anticipated DC Plan:  HOME W HOME HEALTH SERVICES      DC Planning Services  CM consult      [facility]  HOME HEALTH  Resumption Of Svcs/PTA Provider   Choice offered to / List presented to:  C-4 Adult Children        HH arranged  IV Antibiotics  HH-2 PT  HH-1 RN      [facility]  [facility]   Status of service:  Completed, signed off Medicare Important Message given?   (If response is "NO", the following Medicare IM given date fields will be blank) Date Medicare IM given:   Date Additional Medicare IM given:    Discharge Disposition:  HOME W HOME HEALTH SERVICES  Per UR Regulation:    If discussed at Long Length of Stay Meetings, dates discussed:    Comments:  04/09/13 10:23am Vance PeperSusan Madisun Hargrove, RN BSN Case Manager [phone] Home Health RN/PT  [place] with [facility] [phone]. contact person: Veneta PentonJerrie Windom or Toll BrothersMelissa Cornay. Pam with [facility] has arranged for IV antibiotics. All pertinent information has been faxed to Dr. Marjo Bickerhilds  [phone]. Jillian Campbell (patient's daughter) is at hospital and will take her mom to [place]. She will have all discharge and plan of care instructions.

## 2013-04-09 NOTE — Progress Notes (Signed)
Subjective: 4 Days Post-Op Procedure(s) (LRB): IRRIGATION AND DEBRIDEMENT RIGHT ELBOW insertion antibiotic beads (Right) HARDWARE REMOVAL olecranon plate and screws (Right) Patient reports pain as mild.  No complaints. Tolerating diet well.   Objective: Vital signs in last 24 hours: Temp:  [97.5 F (36.4 C)-98.4 F (36.9 C)] 98.4 F (36.9 C) (03/30 0453) Pulse Rate:  [70-90] 70 (03/30 0453) Resp:  [18-19] 18 (03/30 0453) BP: (118-162)/(46-76) 121/76 mmHg (03/30 0453) SpO2:  [95 %-100 %] 95 % (03/30 0453)  Intake/Output from previous day: 03/29 0701 - 03/30 0700 In: 760 [P.O.:560; IV Piggyback:200] Out: -  Intake/Output this shift: Total I/O In: 240 [P.O.:240] Out: -    Recent Labs  04/08/13 0500  HGB 8.9*    Recent Labs  04/08/13 0500  WBC 6.3  RBC 2.97*  HCT 27.1*  PLT 234    Recent Labs  04/07/13 0730 04/08/13 0500  NA  --  140  K  --  4.7  CL  --  102  CO2  --  28  BUN  --  22  CREATININE 1.26* 1.32*  GLUCOSE  --  107*  CALCIUM  --  10.0   No results found for this basename: LABPT, INR,  in the last 72 hours  Sensation intact distally Intact pulses distally Incision: dressing C/D/I Compartment soft  Assessment/Plan: 4 Days Post-Op Procedure(s) (LRB): IRRIGATION AND DEBRIDEMENT RIGHT ELBOW insertion antibiotic beads (Right) HARDWARE REMOVAL olecranon plate and screws (Right) Discharge to SNF Non weight bearing right lower extremity  Jillian Campbell 04/09/2013, 9:57 AM

## 2013-04-10 LAB — GLUCOSE, CAPILLARY: Glucose-Capillary: 130 mg/dL — ABNORMAL HIGH (ref 70–99)

## 2013-04-10 MED ORDER — HEPARIN SOD (PORK) LOCK FLUSH 100 UNIT/ML IV SOLN
250.0000 [IU] | Freq: Every day | INTRAVENOUS | Status: DC
  Filled 2013-04-10: qty 3

## 2013-04-10 MED ORDER — HEPARIN SOD (PORK) LOCK FLUSH 100 UNIT/ML IV SOLN
250.0000 [IU] | INTRAVENOUS | Status: DC | PRN
  Administered 2013-04-10: 250 [IU]
  Filled 2013-04-10: qty 3

## 2013-04-10 NOTE — Discharge Summary (Signed)
Patient ID: Jillian Campbell MRN: [id number] DOB/AGE: Apr 15, 1930 80 y.o.  Admit date: 04/04/2013 Discharge date: 04/10/2013  Admission Diagnoses:  Active Problems:   Infection of elbow   Discharge Diagnoses:  Same  Past Medical History  Diagnosis Date  . Hypertension   . High cholesterol   . Peptic ulcer 1970's  . Arthritis     "hands" (04/04/2013)    Surgeries: Procedure(s): IRRIGATION AND DEBRIDEMENT RIGHT ELBOW insertion antibiotic beads HARDWARE REMOVAL olecranon plate and screws on 04/04/2013 - 04/05/2013   Consultants:  Case management  Discharged Condition: Improved  Hospital Course: Jillian Campbell is an 80 y.o. female who was admitted 04/04/2013 for operative treatment of<principal problem not specified>. Patient has severe unremitting pain that affects sleep, daily activities, and work/hobbies. After pre-op clearance the patient was taken to the operating room on 04/04/2013 - 04/05/2013 and underwent  Procedure(s): IRRIGATION AND DEBRIDEMENT RIGHT ELBOW insertion antibiotic beads HARDWARE REMOVAL olecranon plate and screws.    Patient was given perioperative antibiotics: Anti-infectives   Start     Dose/Rate Route Frequency Ordered Stop   04/09/13 0000  vancomycin (VANCOCIN) 1 GM/200ML SOLN     1,000 mg 200 mL/hr over 60 Minutes Intravenous Every 24 hours 04/09/13 1233     04/09/13 0000  ciprofloxacin (CIPRO) 500 MG tablet     500 mg Oral 2 times daily 04/09/13 1233     04/08/13 2200  vancomycin (VANCOCIN) IVPB 1000 mg/200 mL premix  Status:  Discontinued     1,000 mg 200 mL/hr over 60 Minutes Intravenous Every 24 hours 04/07/13 1450 04/08/13 1009   04/08/13 1030  vancomycin (VANCOCIN) IVPB 1000 mg/200 mL premix     1,000 mg 200 mL/hr over 60 Minutes Intravenous Every 24 hours 04/08/13 1009     04/05/13 2048  vancomycin (VANCOCIN) powder  Status:  Discontinued       As needed 04/05/13 2049 04/05/13 2113   04/05/13 2046  gentamicin (GARAMYCIN) injection  Status:   Discontinued       As needed 04/05/13 2048 04/05/13 2113   04/05/13 2000  ceFAZolin (ANCEF) IVPB 2 g/50 mL premix     2 g 100 mL/hr over 30 Minutes Intravenous  Once 04/05/13 1919 04/05/13 1942   04/05/13 1926  ceFAZolin (ANCEF) 2-3 GM-% IVPB SOLR    Comments:  Bilotta, Mary   : cabinet override      04/05/13 1926 04/06/13 0729   04/05/13 1415  piperacillin-tazobactam (ZOSYN) IVPB 3.375 g  Status:  Discontinued     3.375 g 12.5 mL/hr over 240 Minutes Intravenous 3 times per day 04/05/13 1401 04/05/13 1919   04/05/13 0800  vancomycin (VANCOCIN) 500 mg in sodium chloride 0.9 % 100 mL IVPB  Status:  Discontinued     500 mg 100 mL/hr over 60 Minutes Intravenous Every 12 hours 04/04/13 2131 04/07/13 1450   04/05/13 0600  piperacillin-tazobactam (ZOSYN) IVPB 3.375 g  Status:  Discontinued     3.375 g 12.5 mL/hr over 240 Minutes Intravenous 3 times per day 04/04/13 2131 04/05/13 1919   04/04/13 2000  piperacillin-tazobactam (ZOSYN) IVPB 3.375 g     3.375 g 100 mL/hr over 30 Minutes Intravenous  Once 04/04/13 1851 04/04/13 1939   04/04/13 2000  vancomycin (VANCOCIN) IVPB 1000 mg/200 mL premix     1,000 mg 200 mL/hr over 60 Minutes Intravenous  Once 04/04/13 1854 04/04/13 2009   04/04/13 1730  vancomycin (VANCOCIN) IVPB 1000 mg/200 mL premix  Status:  Discontinued  1,000 mg 200 mL/hr over 60 Minutes Intravenous NOW 04/04/13 1717 04/04/13 1854   04/04/13 1730  piperacillin-tazobactam (ZOSYN) IVPB 3.375 g  Status:  Discontinued     3.375 g 100 mL/hr over 30 Minutes Intravenous NOW 04/04/13 1717 04/04/13 1851       Patient was given sequential compression devices, early ambulation, and chemoprophylaxis to prevent DVT.  Patient benefited maximally from hospital stay and there were no complications.    Recent vital signs: Patient Vitals for the past 24 hrs:  BP Temp Pulse Resp SpO2  04/10/13 0524 143/63 mmHg 97.3 F (36.3 C) 74 18 96 %  04/09/13 2112 144/64 mmHg 98.5 F (36.9 C) 73 16  96 %  04/09/13 1300 128/57 mmHg 98.6 F (37 C) 71 18 99 %     Recent laboratory studies:  Recent Labs  04/08/13 0500  WBC 6.3  HGB 8.9*  HCT 27.1*  PLT 234  NA 140  K 4.7  CL 102  CO2 28  BUN 22  CREATININE 1.32*  GLUCOSE 107*  CALCIUM 10.0     Discharge Medications:     Medication List    STOP taking these medications       aspirin 81 MG chewable tablet  Replaced by:  aspirin 325 MG tablet      TAKE these medications       amLODipine 2.5 MG tablet  Commonly known as:  NORVASC  Take 2.5 mg by mouth daily.     aspirin 325 MG tablet  Take 1 tablet (325 mg total) by mouth 2 (two) times daily after a meal.     atenolol 25 MG tablet  Commonly known as:  TENORMIN  Take 25 mg by mouth daily.     ciprofloxacin 500 MG tablet  Commonly known as:  CIPRO  Take 1 tablet (500 mg total) by mouth 2 (two) times daily.     docusate sodium 100 MG capsule  Commonly known as:  COLACE  Take 1 capsule (100 mg total) by mouth 2 (two) times daily.     HYDROcodone-acetaminophen 5-325 MG per tablet  Commonly known as:  NORCO/VICODIN  Take 1-2 tablets by mouth every 4 (four) hours as needed for moderate pain.     lisinopril 20 MG tablet  Commonly known as:  PRINIVIL,ZESTRIL  Take 20 mg by mouth daily.     multivitamin with minerals Tabs tablet  Take 1 tablet by mouth daily.     simvastatin 20 MG tablet  Commonly known as:  ZOCOR  Take 20 mg by mouth daily.     triamcinolone cream 0.1 %  Commonly known as:  KENALOG  Apply 1 application topically 2 (two) times daily.     vancomycin 1 GM/200ML Soln  Commonly known as:  VANCOCIN  Inject 200 mLs (1,000 mg total) into the vein daily.     VITAMIN D PO  Take 1 tablet by mouth 2 (two) times daily.        Diagnostic Studies: Dg Elbow Complete Right  04/05/2013   CLINICAL DATA:  ORIF distal right humerus. Fall since ORIF. Redness and swelling of the elbow.  EXAM: RIGHT ELBOW - COMPLETE 3+ VIEW  COMPARISON:  DG ELBOW  COMPLETE*R* dated 03/03/2013  FINDINGS: No hardware failure or complication in this patient status post ORIF. Plate and screw fixation of the distal humerus and proximal ulna appears unchanged when compared to fluoroscopic spot films from the fixation. There is soft tissue swelling around the elbow, more prominent  in the medial aspect of the lower arm.  IMPRESSION: No interval change or complicating features ORIF of the right elbow. Soft tissue swelling distal arm.   Electronically Signed   By: Andreas Newport M.D.   On: 04/05/2013 07:17    Disposition: 01-Home or Self Care      Discharge Orders   Future Orders Complete By Expires   Discharge wound care:  As directed    Scheduling Instructions:     keep dressing clean dry and intact   Elevate operative extremity  As directed    Comments:     Encourage patient to wiggle fingers often   Non weight bearing  As directed    Comments:     Non weight bearing right arm Sling for comfort   Questions:     Laterality:  right   Extremity:  Upper      Follow-up Information   Follow up with [facility].   [facility] information[facility]everly Gust [facility] [phone] Contact person: Veneta Penton or Melissa Cornay       Follow up with Kathryne Hitch, MD In 2 weeks. (2 weeks from day of surgery)    Specialty:  Orthopedic Surgery   Contact information:   [address] [place] [address] [phone]        Signed: Richardean Canal 04/10/2013, 7:40 AM

## 2013-04-10 NOTE — Progress Notes (Signed)
Subjective: 5 Days Post-Op Procedure(s) (LRB): IRRIGATION AND DEBRIDEMENT RIGHT ELBOW insertion antibiotic beads (Right) HARDWARE REMOVAL olecranon plate and screws (Right) Patient reports pain as mild.    Objective: Vital signs in last 24 hours: Temp:  [97.3 F (36.3 C)-98.6 F (37 C)] 97.3 F (36.3 C) (03/31 0524) Pulse Rate:  [71-74] 74 (03/31 0524) Resp:  [16-18] 18 (03/31 0524) BP: (128-144)/(57-64) 143/63 mmHg (03/31 0524) SpO2:  [96 %-99 %] 96 % (03/31 0524)  Intake/Output from previous day: 03/30 0701 - 03/31 0700 In: 1183.7 [P.O.:720; I.V.:263.7; IV Piggyback:200] Out: -  Intake/Output this shift:     Recent Labs  04/08/13 0500  HGB 8.9*    Recent Labs  04/08/13 0500  WBC 6.3  RBC 2.97*  HCT 27.1*  PLT 234    Recent Labs  04/08/13 0500  NA 140  K 4.7  CL 102  CO2 28  BUN 22  CREATININE 1.32*  GLUCOSE 107*  CALCIUM 10.0   No results found for this basename: LABPT, INR,  in the last 72 hours  Neurovascular intact Incision: dressing C/D/I  Assessment/Plan: 5 Days Post-Op Procedure(s) (LRB): IRRIGATION AND DEBRIDEMENT RIGHT ELBOW insertion antibiotic beads (Right) HARDWARE REMOVAL olecranon plate and screws (Right) Discharge patient to home. Richardean CanalCLARK, GILBERT 04/10/2013, 7:39 AM

## 2013-04-10 NOTE — Progress Notes (Signed)
Patient discharged to home accompanied by family. Discharge instructions and rx given and explained and patient/family stated understanding. PICC line was heparin locked for discharge. Patient left unit with all personal belongings in a stable condition via wheelchair.

## 2013-04-24 ENCOUNTER — Other Ambulatory Visit: Payer: Self-pay | Admitting: Physician Assistant

## 2013-04-24 ENCOUNTER — Ambulatory Visit (HOSPITAL_COMMUNITY)
Admission: RE | Admit: 2013-04-24 | Discharge: 2013-04-24 | Disposition: A | Discharge status: Home / Self Care | Payer: Medicare Other | Source: Ambulatory Visit | Attending: Orthopaedic Surgery | Admitting: Orthopaedic Surgery

## 2013-04-24 DIAGNOSIS — T8140XA Infection following a procedure, unspecified, initial encounter: Secondary | ICD-10-CM

## 2013-04-24 DIAGNOSIS — Z452 Encounter for adjustment and management of vascular access device: Secondary | ICD-10-CM | POA: Insufficient documentation

## 2013-04-24 NOTE — Progress Notes (Signed)
Dressing to left arm CDI, pt d/c home with family

## 2015-09-12 DEATH — deceased

## 2015-11-18 IMAGING — CR DG ELBOW 2V*R*
2 series · 2 of 2 positions shown · non-contrast
Comparison: Right forearm x-rays obtained concurrently.

CLINICAL DATA: Fell and injured right arm. Bruising throughout the
arm.

EXAM:
RIGHT ELBOW - 2 VIEW

[x elbow ap right]
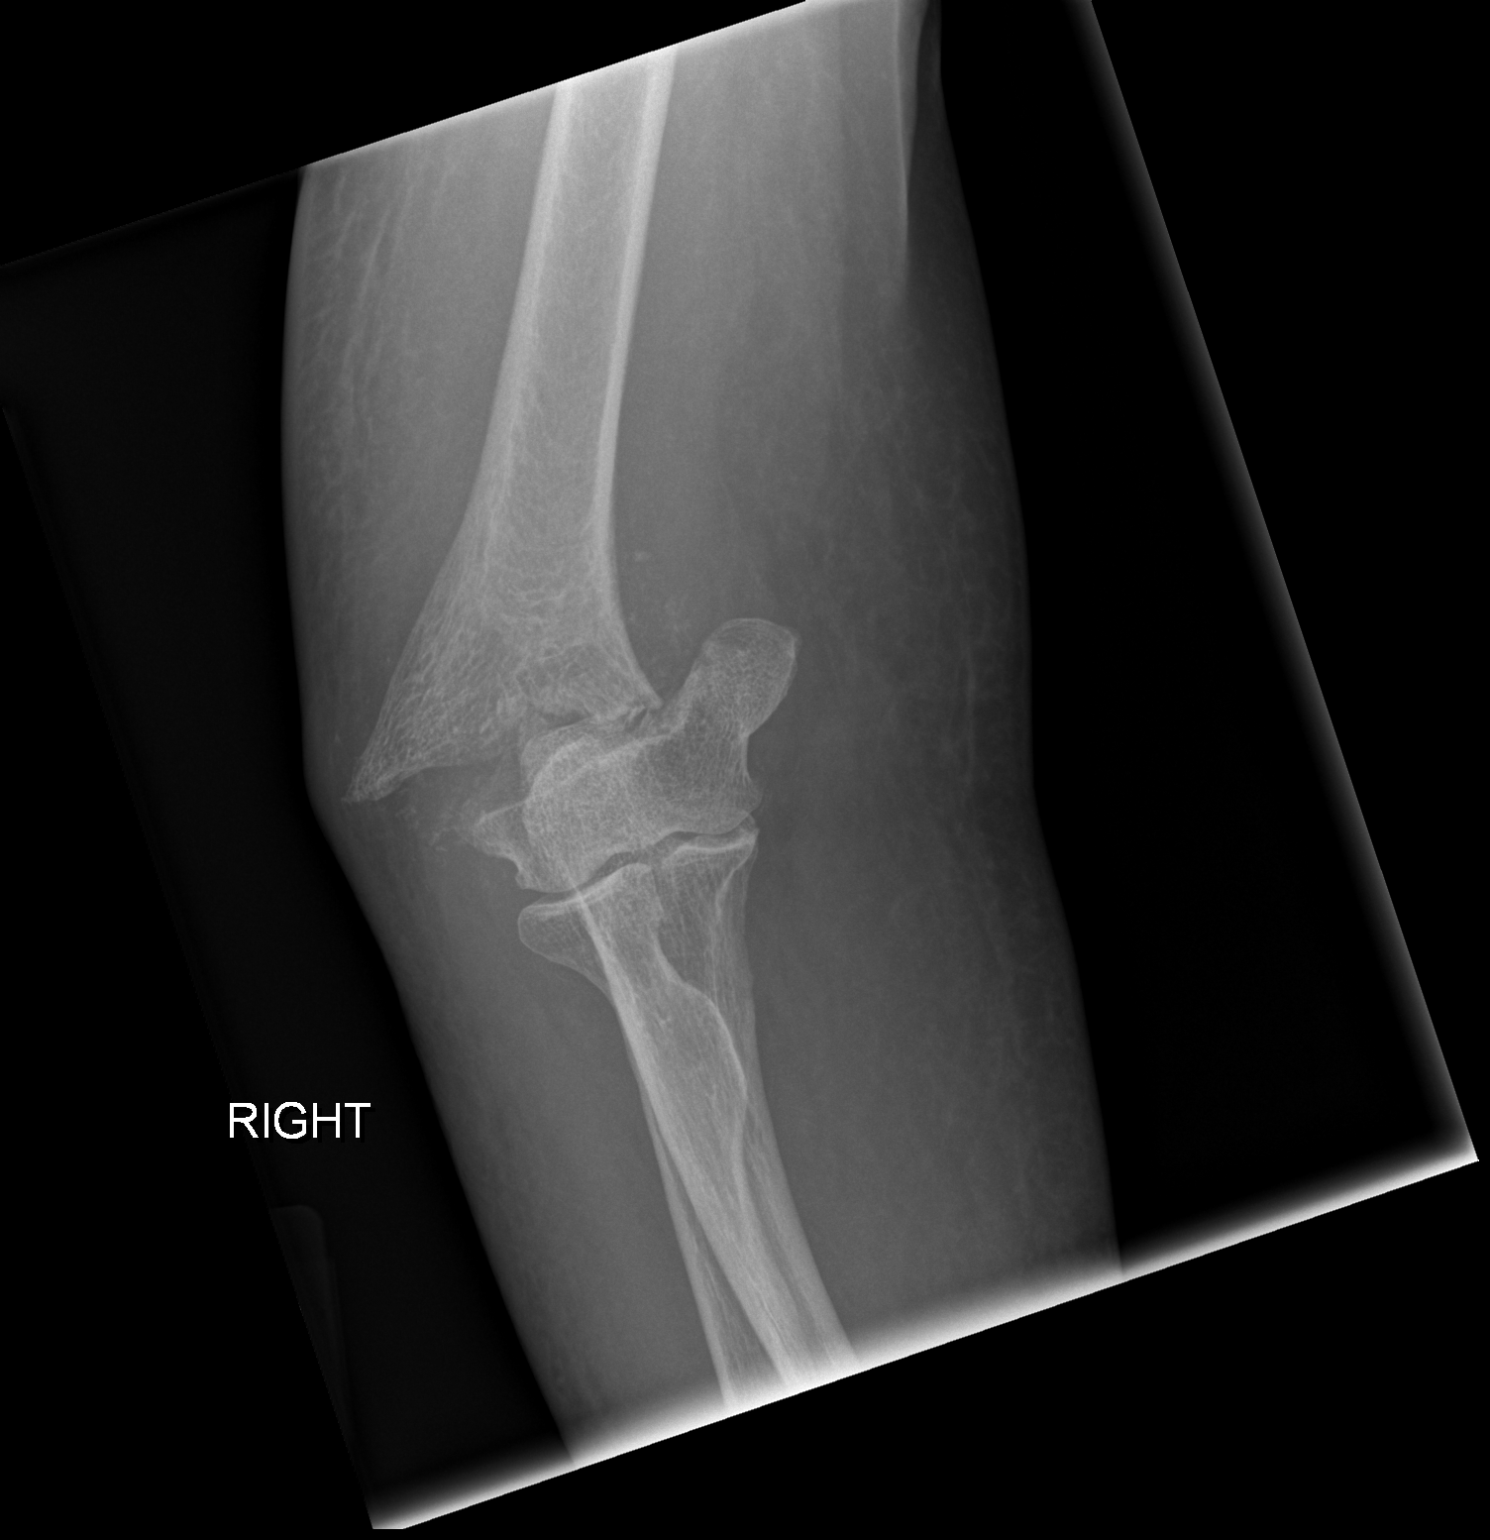

[x elbow lat right]
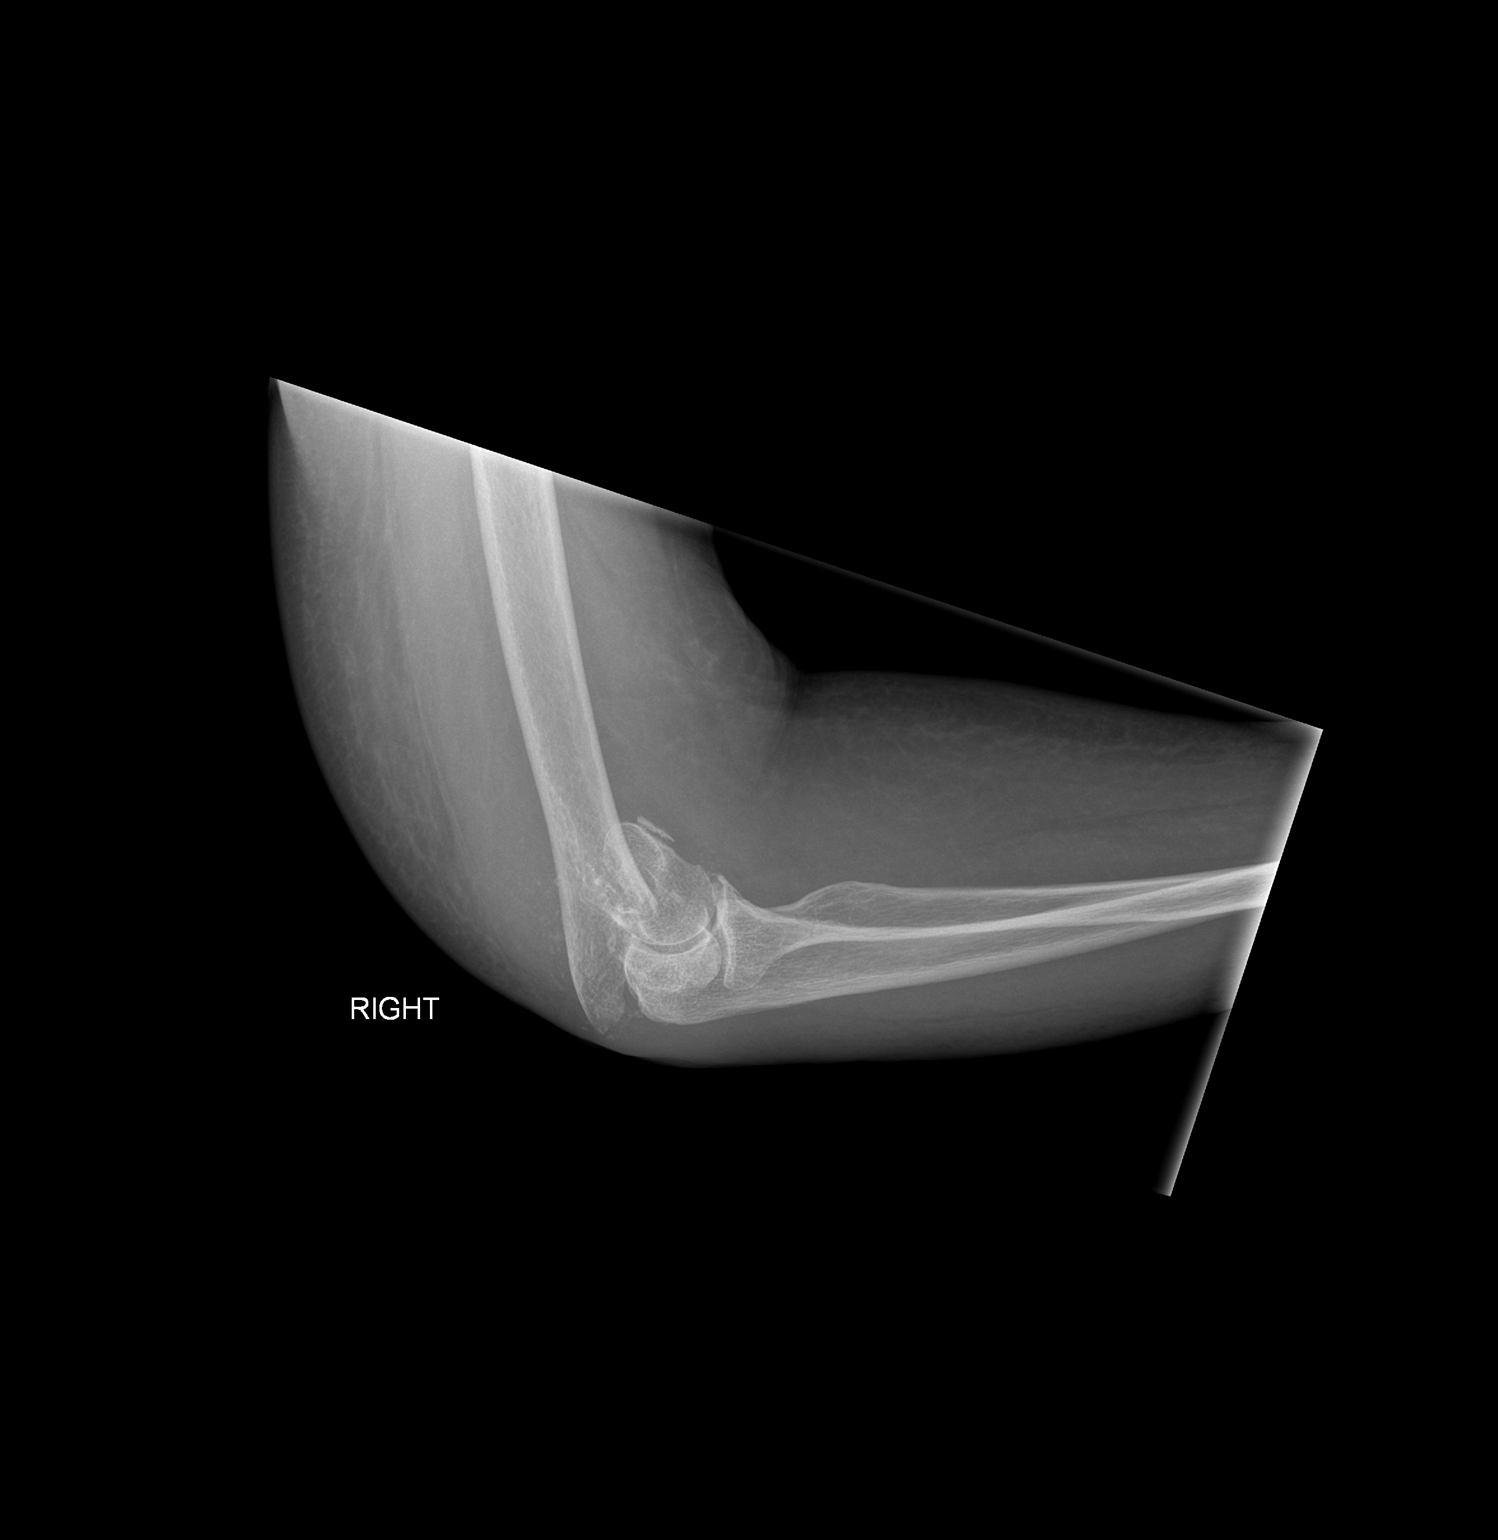

[2 of 2 positions shown; findings below may reference images not displayed]

FINDINGS: Comminuted transverse fracture involving the supracondylar humerus.
Humeroulnar joint and humeroradial joint intact with moderate joint
space narrowing. No other visible fractures about the elbow. Large
joint effusion/hemarthrosis. Diffuse soft tissue swelling.
IMPRESSION: Comminuted transverse fracture involving the supracondylar humerus.
Elbow joint intact with degenerative changes.

## 2015-11-18 IMAGING — CR DG FOREARM 2V*R*
2 series · 2 of 2 positions shown · non-contrast
Comparison: None.

CLINICAL DATA: Fall, elbow pain

EXAM:
RIGHT FOREARM - 2 VIEW

[x forearm ap right]
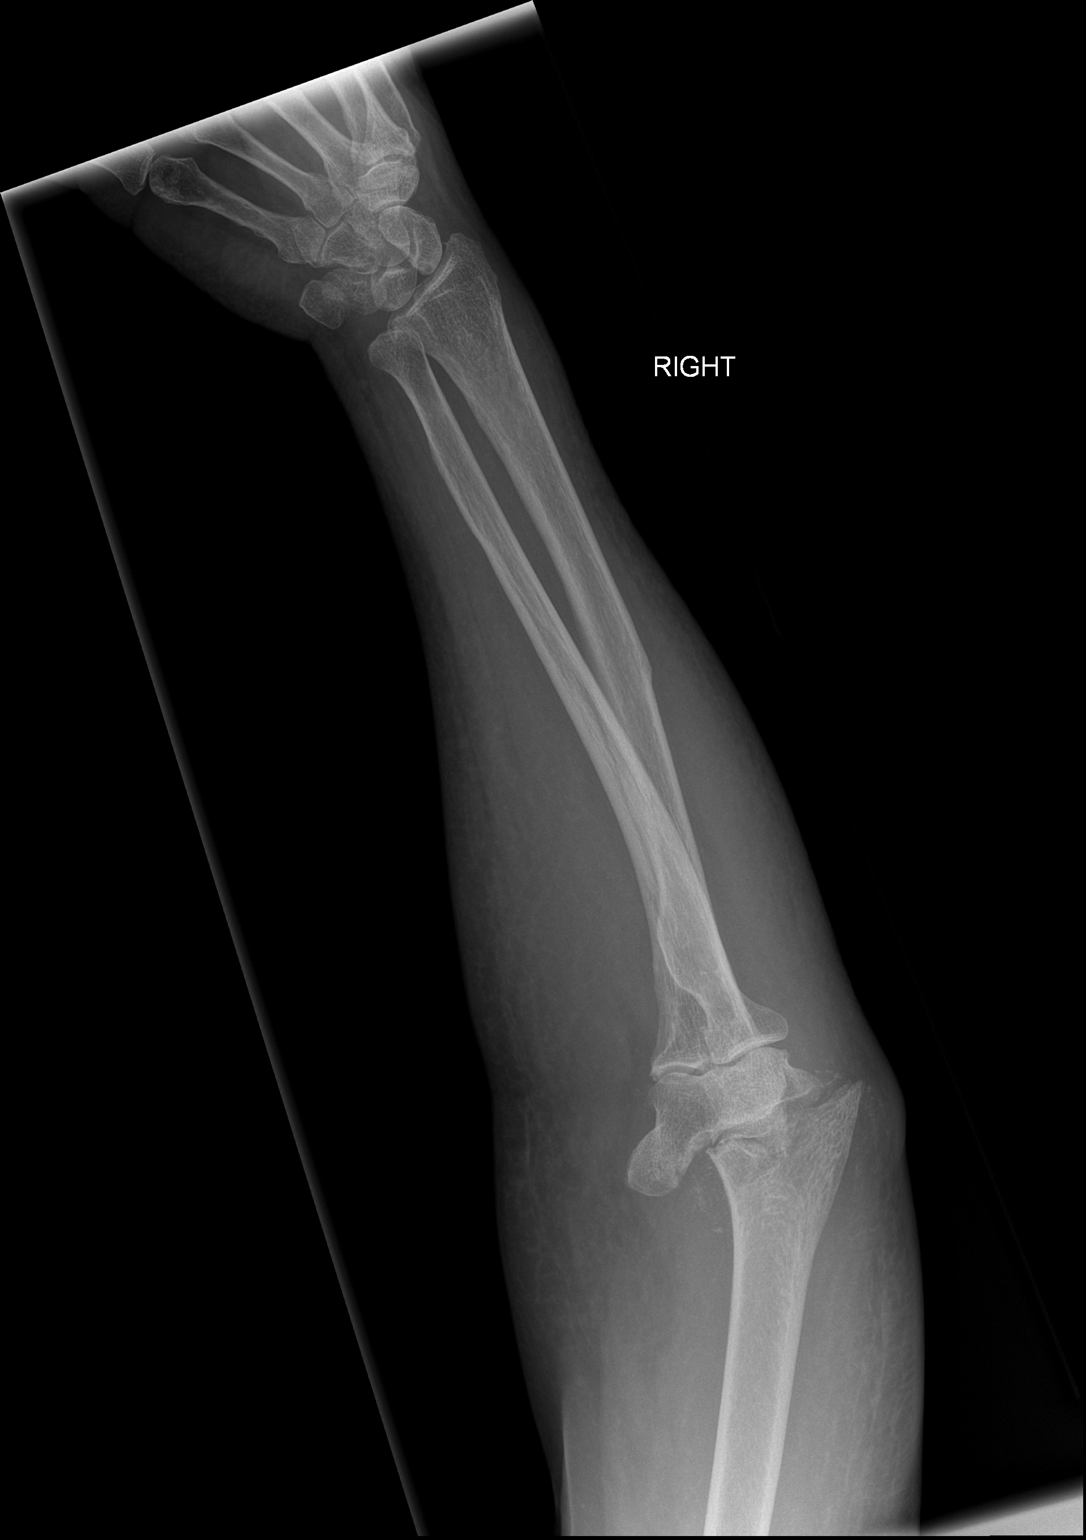

[x forearm lat right]
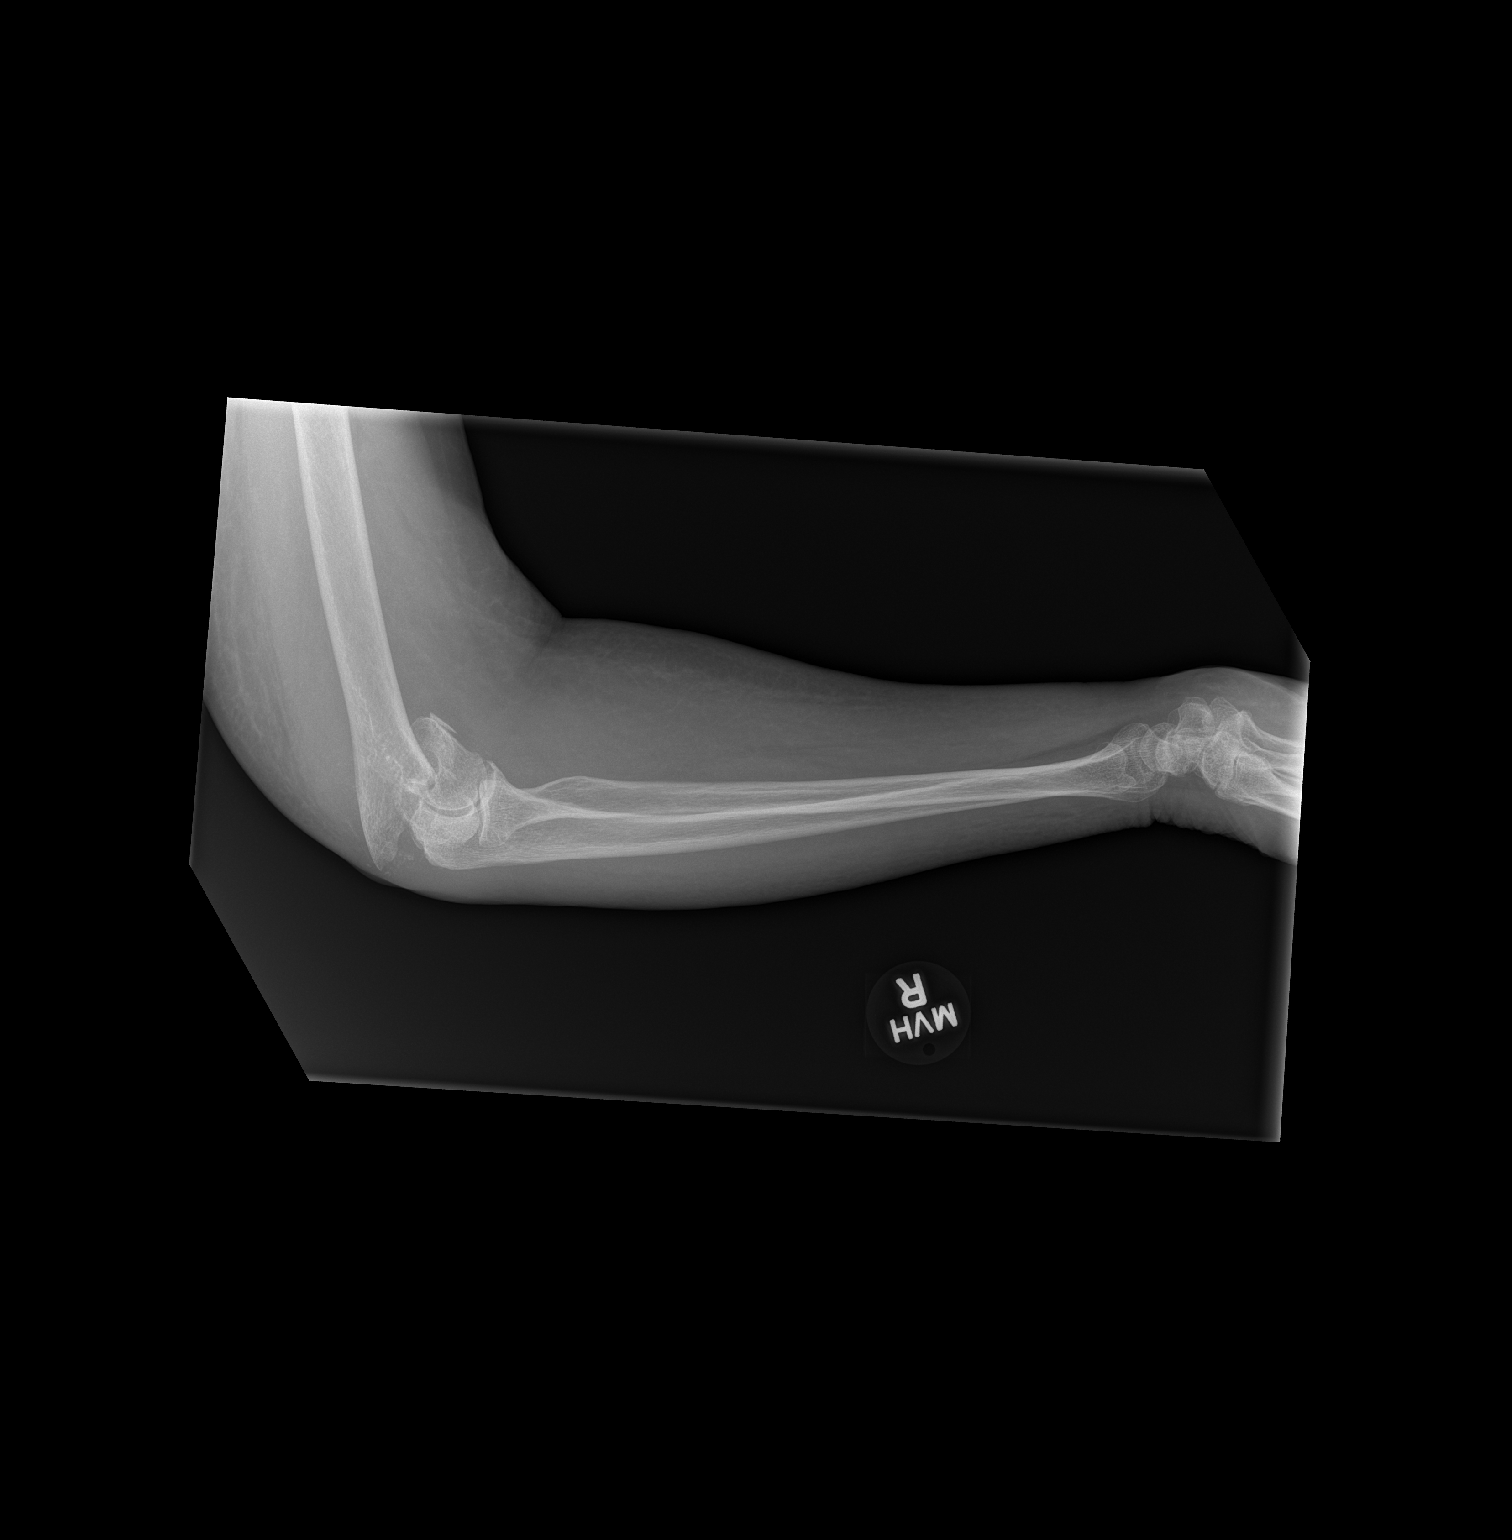

[2 of 2 positions shown; findings below may reference images not displayed]

FINDINGS: There is an acute displaced right distal humerus supracondylar
fracture. Diffuse soft tissue swelling noted on the lateral view.
Bones are osteopenic. Radius and ulna appear intact.
IMPRESSION: Acute displaced right distal humerus supracondylar fracture at the
elbow.
